# Patient Record
Sex: Male | Born: 1940 | Race: White | Hispanic: No | Marital: Married | Smoking: Current every day smoker
Health system: Southern US, Community
[De-identification: ages and names within clinical notes are randomized; demographics above are authoritative.]

## PROBLEM LIST (undated history)

## (undated) DIAGNOSIS — F419 Anxiety disorder, unspecified: Secondary | ICD-10-CM

## (undated) DIAGNOSIS — H9190 Unspecified hearing loss, unspecified ear: Secondary | ICD-10-CM

## (undated) DIAGNOSIS — J449 Chronic obstructive pulmonary disease, unspecified: Secondary | ICD-10-CM

## (undated) DIAGNOSIS — I10 Essential (primary) hypertension: Secondary | ICD-10-CM

## (undated) DIAGNOSIS — F319 Bipolar disorder, unspecified: Secondary | ICD-10-CM

## (undated) DIAGNOSIS — C801 Malignant (primary) neoplasm, unspecified: Secondary | ICD-10-CM

## (undated) DIAGNOSIS — K219 Gastro-esophageal reflux disease without esophagitis: Secondary | ICD-10-CM

## (undated) HISTORY — PX: CATARACT EXTRACTION, BILATERAL: SHX1313

## (undated) HISTORY — PX: APPENDECTOMY: SHX54

## (undated) HISTORY — PX: SKIN CANCER EXCISION: SHX779

---

## 2016-12-25 ENCOUNTER — Other Ambulatory Visit (HOSPITAL_COMMUNITY): Payer: Self-pay | Admitting: Pulmonary Disease

## 2016-12-25 DIAGNOSIS — R918 Other nonspecific abnormal finding of lung field: Secondary | ICD-10-CM

## 2016-12-30 ENCOUNTER — Ambulatory Visit
Admission: RE | Admit: 2016-12-30 | Discharge: 2016-12-30 | Disposition: A | Discharge status: Home / Self Care | Payer: Self-pay | Source: Ambulatory Visit | Attending: Pulmonary Disease | Admitting: Pulmonary Disease

## 2016-12-30 ENCOUNTER — Other Ambulatory Visit (HOSPITAL_COMMUNITY): Payer: Self-pay | Admitting: Pulmonary Disease

## 2016-12-30 DIAGNOSIS — R0602 Shortness of breath: Secondary | ICD-10-CM

## 2017-01-14 ENCOUNTER — Other Ambulatory Visit (HOSPITAL_COMMUNITY): Payer: Self-pay | Admitting: Pulmonary Disease

## 2017-01-14 DIAGNOSIS — R918 Other nonspecific abnormal finding of lung field: Secondary | ICD-10-CM

## 2017-01-28 ENCOUNTER — Ambulatory Visit (HOSPITAL_COMMUNITY): Payer: Self-pay

## 2017-02-02 ENCOUNTER — Encounter (HOSPITAL_COMMUNITY)
Admission: RE | Admit: 2017-02-02 | Discharge: 2017-02-02 | Disposition: A | Discharge status: Home / Self Care | Payer: Medicare Other | Source: Ambulatory Visit | Attending: Pulmonary Disease | Admitting: Pulmonary Disease

## 2017-02-02 DIAGNOSIS — R918 Other nonspecific abnormal finding of lung field: Secondary | ICD-10-CM | POA: Insufficient documentation

## 2017-02-02 LAB — GLUCOSE, CAPILLARY: GLUCOSE-CAPILLARY: 118 mg/dL — AB (ref 65–99)

## 2017-02-02 MED ORDER — FLUDEOXYGLUCOSE F - 18 (FDG) INJECTION
7.7000 | Freq: Once | INTRAVENOUS | Status: AC | PRN
  Administered 2017-02-02: 7.7 via INTRAVENOUS

## 2017-02-10 ENCOUNTER — Ambulatory Visit (HOSPITAL_COMMUNITY)
Admission: RE | Admit: 2017-02-10 | Discharge: 2017-02-10 | Disposition: A | Discharge status: Home / Self Care | Payer: Medicare Other | Source: Ambulatory Visit | Attending: Pulmonary Disease | Admitting: Pulmonary Disease

## 2017-02-10 ENCOUNTER — Other Ambulatory Visit: Payer: Self-pay | Admitting: Radiology

## 2017-02-11 ENCOUNTER — Ambulatory Visit (HOSPITAL_COMMUNITY)
Admission: RE | Admit: 2017-02-11 | Discharge: 2017-02-11 | Disposition: A | Discharge status: Home / Self Care | Payer: Medicare Other | Source: Ambulatory Visit | Attending: Pulmonary Disease | Admitting: Pulmonary Disease

## 2017-02-11 DIAGNOSIS — I10 Essential (primary) hypertension: Secondary | ICD-10-CM | POA: Insufficient documentation

## 2017-02-11 DIAGNOSIS — Z87891 Personal history of nicotine dependence: Secondary | ICD-10-CM | POA: Insufficient documentation

## 2017-02-11 DIAGNOSIS — E785 Hyperlipidemia, unspecified: Secondary | ICD-10-CM | POA: Insufficient documentation

## 2017-02-11 DIAGNOSIS — J449 Chronic obstructive pulmonary disease, unspecified: Secondary | ICD-10-CM | POA: Diagnosis not present

## 2017-02-11 DIAGNOSIS — Z7951 Long term (current) use of inhaled steroids: Secondary | ICD-10-CM | POA: Diagnosis not present

## 2017-02-11 DIAGNOSIS — Z79899 Other long term (current) drug therapy: Secondary | ICD-10-CM | POA: Insufficient documentation

## 2017-02-11 DIAGNOSIS — J8410 Pulmonary fibrosis, unspecified: Secondary | ICD-10-CM | POA: Diagnosis not present

## 2017-02-11 DIAGNOSIS — R918 Other nonspecific abnormal finding of lung field: Secondary | ICD-10-CM

## 2017-02-11 LAB — CBC
HEMATOCRIT: 40.7 % (ref 39.0–52.0)
Hemoglobin: 14.3 g/dL (ref 13.0–17.0)
MCH: 30.9 pg (ref 26.0–34.0)
MCHC: 35.1 g/dL (ref 30.0–36.0)
MCV: 87.9 fL (ref 78.0–100.0)
Platelets: 152 10*3/uL (ref 150–400)
RBC: 4.63 MIL/uL (ref 4.22–5.81)
RDW: 14 % (ref 11.5–15.5)
WBC: 12.9 10*3/uL — AB (ref 4.0–10.5)

## 2017-02-11 LAB — PROTIME-INR
INR: 0.93
Prothrombin Time: 12.4 seconds (ref 11.4–15.2)

## 2017-02-11 LAB — APTT: aPTT: 20 seconds — ABNORMAL LOW (ref 24–36)

## 2017-02-11 MED ORDER — SODIUM CHLORIDE 0.9 % IV SOLN: INTRAVENOUS | Status: DC

## 2017-02-11 MED ORDER — SODIUM CHLORIDE 0.9 % IV SOLN
INTRAVENOUS | Status: AC | PRN
  Administered 2017-02-11: 10 mL/h via INTRAVENOUS

## 2017-02-11 MED ORDER — FENTANYL CITRATE (PF) 100 MCG/2ML IJ SOLN
INTRAMUSCULAR | Status: AC | PRN
  Administered 2017-02-11: 50 ug via INTRAVENOUS

## 2017-02-11 MED ORDER — FENTANYL CITRATE (PF) 100 MCG/2ML IJ SOLN
INTRAMUSCULAR | Status: AC
  Filled 2017-02-11: qty 4

## 2017-02-11 MED ORDER — LIDOCAINE HCL (PF) 1 % IJ SOLN
INTRAMUSCULAR | Status: AC
  Filled 2017-02-11: qty 30

## 2017-02-11 MED ORDER — HYDROCODONE-ACETAMINOPHEN 5-325 MG PO TABS
1.0000 | ORAL_TABLET | ORAL | Status: DC | PRN
  Filled 2017-02-11: qty 2

## 2017-02-11 MED ORDER — MIDAZOLAM HCL 2 MG/2ML IJ SOLN
INTRAMUSCULAR | Status: AC | PRN
  Administered 2017-02-11: 1 mg via INTRAVENOUS

## 2017-02-11 MED ORDER — MIDAZOLAM HCL 2 MG/2ML IJ SOLN
INTRAMUSCULAR | Status: AC
  Filled 2017-02-11: qty 4

## 2017-02-11 NOTE — H&P (Signed)
Chief Complaint: Patient was seen in consultation today for lung mass  Referring Physician(s): Hawkins,Edward  Supervising Physician: Arne Cleveland  Patient Status: [facility] - Out-pt  History of Present Illness: Danny Booker is a 76 y.o. male with past medical history of COPD, chronic cough, HTN, HLD, who is followed by pulmonologist for lung nodule.   Patient underwent CTA Chest 5/8/218 who showed: 1.  No evidence for pulmonary embolism 2.  Enlarging nodular densities involving the anterior right upper lobe and posterior lateral left upper lobe.  3.  Irregular groundglass consolidation involving the posterior medial left lower lobe.  IR consulted for lung mass biopsy at the request of Dr. Luan Pulling.  Case reviewed by Dr. Earleen Newport who has approved patient for LLL mass biopsy.   Patient presents today in his usual state of health.  He states he has been feeling well.  He has been NPO.  He does not take blood thinners.   No past medical history on file.  No past surgical history on file.  Allergies: Patient has no known allergies.  Medications: Prior to Admission medications   Medication Sig Start Date End Date Taking? Authorizing Provider  amLODipine (NORVASC) 5 MG tablet Take 5 mg by mouth daily.   Yes [provider]  atorvastatin (LIPITOR) 10 MG tablet Take 10 mg by mouth at bedtime.   Yes [provider]  co-enzyme Q-10 30 MG capsule Take 30 mg by mouth 2 (two) times daily.   Yes [provider]  Fluticasone-Umeclidin-Vilant (TRELEGY ELLIPTA) 100-62.5-25 MCG/INH AEPB Inhale 1 puff into the lungs at bedtime.   Yes [provider]  Ibuprofen-Diphenhydramine HCl (ADVIL PM) 200-25 MG CAPS Take 1 tablet by mouth at bedtime as needed (for pain/sleep.).   Yes [provider]  Javier Docker Oil 500 MG CAPS Take 500 mg by mouth at bedtime.   Yes [provider]  losartan-hydrochlorothiazide (HYZAAR) 100-12.5 MG tablet Take 1 tablet by  mouth daily.   Yes [provider]  Misc Natural Products (OSTEO BI-FLEX ADV TRIPLE ST PO) Take 1 tablet by mouth 2 (two) times daily.   Yes [provider]  Multiple Vitamin (MULTIVITAMIN WITH MINERALS) TABS tablet Take 1 tablet by mouth daily. One-A-Day Men's 50+   Yes [provider]  ranitidine (ZANTAC) 150 MG tablet Take 150 mg by mouth at bedtime as needed for heartburn.   Yes [provider]  sertraline (ZOLOFT) 100 MG tablet Take 100 mg by mouth daily.   Yes [provider]     No family history on file.  Social History   Social History  . Marital status: Unknown    Spouse name: N/A  . Number of children: N/A  . Years of education: N/A   Social History Main Topics  . Smoking status: Not on file  . Smokeless tobacco: Not on file  . Alcohol use Not on file  . Drug use: Unknown  . Sexual activity: Not on file   Other Topics Concern  . Not on file   Social History Narrative  . No narrative on file    Review of Systems  Constitutional: Negative for fatigue and fever.  Respiratory: Positive for cough (chronic), shortness of breath (chronic) and wheezing (chronic).   Cardiovascular: Negative for chest pain.  Psychiatric/Behavioral: Negative for behavioral problems and confusion.    Vital Signs: BP (!) 150/72   Pulse 86   Temp 97.6 F (36.4 C)   Resp 20   Ht 6' (1.829  m)   Wt 175 lb (79.4 kg)   SpO2 92%   BMI 23.73 kg/m   Physical Exam  Constitutional: He is oriented to person, place, and time. He appears well-developed.  Cardiovascular: Normal rate, regular rhythm and normal heart sounds.   Pulmonary/Chest: Effort normal. He has wheezes. He has rales.  Coarse throughout  Neurological: He is alert and oriented to person, place, and time.  Skin: Skin is warm and dry.  Psychiatric: He has a normal mood and affect. His behavior is normal. Judgment and thought content normal.  Nursing note and vitals  reviewed.   Mallampati Score:  MD Evaluation Airway: WNL Heart: WNL Abdomen: WNL Chest/ Lungs: WNL ASA  Classification: 3 Mallampati/Airway Score: Two  Imaging: Nm Pet Image Initial (pi) Skull Base To Thigh  Result Date: 02/02/2017 CLINICAL DATA:  Initial treatment strategy for lung mass. EXAM: NUCLEAR MEDICINE PET SKULL BASE TO THIGH TECHNIQUE: 7.7 mCi F-18 FDG was injected intravenously. Full-ring PET imaging was performed from the skull base to thigh after the radiotracer. CT data was obtained and used for attenuation correction and anatomic localization. FASTING BLOOD GLUCOSE:  Value: 118 mg/dl COMPARISON:  11/26/2016 FINDINGS: NECK No hypermetabolic lymph nodes in the neck. There is a 2.8 cm mixed attenuation nodule arising from the posterior aspect of the left lobe of thyroid gland. No significant FDG uptake within this nodule. CHEST Normal heart size. Aortic atherosclerosis is identified. Calcifications within the RCA, left circumflex and LAD coronary arteries noted. Hypermetabolic mass within the posteromedial left lower lobe measures 4.4 cm and has an SUV max equal to 14.58. Separate perifissural nodule within the posterolateral left upper lobe is identified 2.6 cm and has an SUV max equal to 2.5, image 76 of series 4. Within the left upper lobe there is a nodule measuring 11 by 6 mm (mean diameter 8 mm) with an SUV max equal to 2.76. Hypermetabolic left hilar and left paratracheal lymph nodes identified. The left paratracheal node measures 9 mm and has an SUV max equal to 13.57. Right paratracheal lymph node measures 8 mm and has an SUV max equal to 3.16. ABDOMEN/PELVIS No abnormal hypermetabolic activity within the liver, pancreas, adrenal glands, or spleen. Aortic atherosclerosis. Infrarenal abdominal aorta is ectatic measuring 2.5 cm, image 134 of series 4. Nonobstructing right renal calculi identified. Hyperdense lesion arising from the inferior pole of the left kidney is incompletely  characterized without IV contrast measuring 11 mm, image 143 of series 4. No hypermetabolic lymph nodes in the abdomen or pelvis. SKELETON No focal hypermetabolic activity to suggest skeletal metastasis. IMPRESSION: 1. Mass within the posteromedial left upper lobe is suspicious for primary bronchogenic carcinoma. 2. Hypermetabolic left hilar, left paratracheal and right paratracheal lymph nodes worrisome for metastatic disease. 3. Perifissural nodularity within the posterior left upper lobe and parenchymal nodule is identified within the right upper lobe. Both of these exhibit mild increased FDG uptake. Cannot rule out metastatic disease. 4. Aortic Atherosclerosis (ICD10-I70.0). Ectatic abdominal aorta at risk for aneurysm development. Recommend followup by ultrasound in 5 years. This recommendation follows ACR consensus guidelines: White Paper of the ACR Incidental Findings Committee II on Vascular Findings. J Am Coll Radiol 2013; 10:789-794. 5. Multi vessel coronary artery calcification 6. Hyperdense lesion arising from inferior pole of left kidney is incompletely characterized without IV contrast. This may represent a hemorrhagic cyst, proteinaceous cyst or solid kidney lesion. Electronically Signed   By: Kerby Moors M.D.   On: 02/02/2017 16:08    Labs:  CBC:  Recent Labs  02/11/17 0919  WBC 12.9*  HGB 14.3  HCT 40.7  PLT 152    COAGS:  Recent Labs  02/11/17 0919  INR 0.93  APTT 20*    BMP: No results for input(s): NA, K, CL, CO2, GLUCOSE, BUN, CALCIUM, CREATININE, GFRNONAA, GFRAA in the last 8760 hours.  Invalid input(s): CMP  LIVER FUNCTION TESTS: No results for input(s): BILITOT, AST, ALT, ALKPHOS, PROT, ALBUMIN in the last 8760 hours.  TUMOR MARKERS: No results for input(s): AFPTM, CEA, CA199, CHROMGRNA in the last 8760 hours.  Assessment and Plan: Patient with extensive smoking history is found to have pulmonary nodules.  IR consulted for lung mass biopsy at the  request of Dr. Luan Pulling.  Patient presents for procedure today in his usual state of health.  He has been NPO.  He does not take blood thinners.  Risks and benefits discussed with the patient including, but not limited to bleeding, infection, damage to adjacent structures or low yield requiring additional tests. All of the patient's questions were answered, patient is agreeable to proceed. Consent signed and in chart.  Thank you for this interesting consult.  I greatly enjoyed meeting Danny Booker and look forward to participating in their care.  A copy of this report was sent to the requesting provider on this date.  Electronically Signed: Docia Barrier, PA 02/11/2017, 10:40 AM   I spent a total of  30 Minutes   in face to face in clinical consultation, greater than 50% of which was counseling/coordinating care for lung mass

## 2017-02-11 NOTE — Procedures (Signed)
  Procedure:   CT LLL lung lesion bx  18g x2 to surg path Preprocedure diagnosis:  Lung cancer Postprocedure diagnosis:  same EBL:     minimal Complications:   none immediate  See full dictation in [employer].  Dillard Cannon MD Main # [phone] Pager  [phone]

## 2017-02-11 NOTE — Sedation Documentation (Signed)
Patient denies pain and is resting comfortably.  

## 2017-02-11 NOTE — Discharge Instructions (Signed)
Needle Biopsy of the Lung, Care After °This sheet gives you information about how to care for yourself after your procedure. Your health care provider may also give you more specific instructions. If you have problems or questions, contact your health care provider. °What can I expect after the procedure? °After the procedure, it is common to have: °· Soreness, pain, and tenderness where a tissue sample was taken (biopsy site). °· A cough. °· A sore throat. ° °Follow these instructions at home: °Biopsy site care °· Follow instructions from your health care provider about when to remove the bandage that was placed on the biopsy site. °· Keep the bandage dry until it has been removed. °· Check your biopsy site every day for signs of infection. Check for: °? More redness, swelling, or pain. °? More fluid or blood. °? Warmth to the touch. °? Pus or a bad smell. °General instructions °· Rest as directed by your health care provider. Ask your health care provider what activities are safe for you. °· Do not take baths, swim, or use a hot tub until your health care provider approves. °· Take over-the-counter and prescription medicines only as told by your health care provider. °· If you have airplane travel scheduled, talk with your health care provider about when it is safe for you to travel by airplane. °· It is up to you to get the results of your procedure. Ask your health care provider, or the department that is doing the procedure, when your results will be ready. °· Keep all follow-up visits as told by your health care provider. This is important. °Contact a health care provider if: °· You have more redness, swelling, or pain around your biopsy site. °· You have more fluid or blood coming from your biopsy site. °· Your biopsy site feels warm to the touch. °· You have pus or a bad smell coming from your biopsy site. °· You have a fever. °· You have pain that does not get better with medicine. °Get help right away  if: °· You have problems breathing. °· You have chest pain. °· You cough up blood. °· You faint. °· You have a fast heart rate. °Summary °· After a needle biopsy of the lung, it is common to have a cough, a sore throat, or soreness, pain, and tenderness where a tissue sample was taken (biopsy site). °· You should check your biopsy area every day for signs of infection, including pus or a bad smell, warmth, more fluid or blood, or more redness, swelling, or pain. °· You should not take baths, swim, or use a hot tub until your health care provider approves. °· It is up to you to get the results of your procedure. Ask your health care provider, or the department that is doing the procedure, when your results will be ready. °This information is not intended to replace advice given to you by your health care provider. Make sure you discuss any questions you have with your health care provider. °Document Released: 05/04/2007 Document Revised: 05/28/2016 Document Reviewed: 05/28/2016 °Elsevier Interactive Patient Education © 2017 Elsevier Inc. ° ° °

## 2017-08-19 ENCOUNTER — Other Ambulatory Visit (HOSPITAL_COMMUNITY): Payer: Self-pay | Admitting: Pulmonary Disease

## 2017-08-19 DIAGNOSIS — R918 Other nonspecific abnormal finding of lung field: Secondary | ICD-10-CM

## 2017-08-26 ENCOUNTER — Ambulatory Visit (HOSPITAL_COMMUNITY)
Admission: RE | Admit: 2017-08-26 | Discharge: 2017-08-26 | Disposition: A | Discharge status: Home / Self Care | Payer: Medicare Other | Source: Ambulatory Visit | Attending: Pulmonary Disease | Admitting: Pulmonary Disease

## 2017-08-26 DIAGNOSIS — R918 Other nonspecific abnormal finding of lung field: Secondary | ICD-10-CM | POA: Insufficient documentation

## 2017-08-31 NOTE — Patient Instructions (Signed)
Danny Booker  08/31/2017     _0 @   Your procedure is scheduled on  09/04/2017   Report to [facility] at  615  A.M.  Call this number if you have problems the morning of surgery:  [phone]   Remember:  Do not eat food or drink liquids after midnight.  Take these medicines the morning of surgery with A SIP OF WATER  Norvasc, losartan, zantac, zoloft.   Do not wear jewelry, make-up or nail polish.  Do not wear lotions, powders, or perfumes, or deodorant.  Do not shave 48 hours prior to surgery.  Men may shave face and neck.  Do not bring valuables to the hospital.  [facility] is not responsible for any belongings or valuables.  Contacts, dentures or bridgework may not be worn into surgery.  Leave your suitcase in the car.  After surgery it may be brought to your room.  For patients admitted to the hospital, discharge time will be determined by your treatment team.  Patients discharged the day of surgery will not be allowed to drive home.   Name and phone number of your driver:   family Special instructions:  None  Please read over the following fact sheets that you were given. Anesthesia Post-op Instructions and Care and Recovery After Surgery      Flexible Bronchoscopy Bronchoscopy is a procedure used to examine the passageways in the lungs. During the procedure a thin, flexible tool with a lens and camera or eyepiece is passed in your mouth or nose, down the windpipe (trachea), and into the air tubes (bronchi). This tool allows your health care provider to carefully look at your lungs from the inside and take diagnostic samples if needed. Tell a health care provider about:  Allergies to food or medicine.  All medicines you are taking, including blood thinners, vitamins, herbs, eye drops, creams, and over-the-counter medicines.  Any problems you or family members have had with anesthetic medicines.  Any blood disorders you have.  Any  surgeries you have had.  Medical conditions you have, including heart disease, diabetes, or kidney problems.  Possibility of pregnancy, if this applies. What are the risks? Generally, this is a safe procedure. However, as with any procedure, problems can occur. Possible problems include:  Collapsed lung (pneumothorax).  Bleeding.  Increased need for oxygen or difficulty breathing after the procedure.  What happens before the procedure? Do not eat or drink anything after midnight on the night before the procedure or as directed by your health care provider. What happens during the procedure?  Relax as much as possible during the procedure.  Medicines may be given to relax you, dry up your secretions, and control coughing.  A numbing medicine (local anesthetic) will be given to numb your mouth, nose, throat, and voice box (larynx). You will be able to breathe normally during the procedure.  Samples of airway secretions may be collected for testing.  If abnormal areas are seen in your airways, tissue samples may be taken for examination under a microscope (biopsy).  If tissue samples are needed from the outer portions of the lung, a type of X-ray called fluoroscopy may be done.  If bleeding occurs, a drug may be used to stop or decrease the bleeding. What happens after the procedure?  You may receive a chest X-ray following the procedure. This is to make sure the lungs have not collapsed (pneumothorax). This information is not  intended to replace advice given to you by your health care provider. Make sure you discuss any questions you have with your health care provider. Document Released: 07/04/2000 Document Revised: 12/13/2015 Document Reviewed: 03/11/2013 Elsevier Interactive Patient Education  2017 [place].  Flexible Bronchoscopy, Care After These instructions give you information on caring for yourself after your procedure. Your doctor may also give you more specific  instructions. Call your doctor if you have any problems or questions after your procedure. Follow these instructions at home:  Do not eat or drink anything for 2 hours after your procedure. If you try to eat or drink before the medicine wears off, food or drink could go into your lungs. You could also burn yourself.  After 2 hours have passed and when you can cough and gag normally, you may eat soft food and drink liquids slowly.  The day after the test, you may eat your normal diet.  You may do your normal activities.  Keep all doctor visits. Get help right away if:  You get more and more short of breath.  You get light-headed.  You feel like you are going to pass out (faint).  You have chest pain.  You have new problems that worry you.  You cough up more than a little blood.  You cough up more blood than before. This information is not intended to replace advice given to you by your health care provider. Make sure you discuss any questions you have with your health care provider. Document Released: 05/04/2009 Document Revised: 12/13/2015 Document Reviewed: 03/11/2013 Elsevier Interactive Patient Education  2017 [place] Anesthesia is a term that refers to techniques, procedures, and medicines that help a person stay safe and comfortable during a medical procedure. Monitored anesthesia care, or sedation, is one type of anesthesia. Your anesthesia specialist may recommend sedation if you will be having a procedure that does not require you to be unconscious, such as:  Cataract surgery.  A dental procedure.  A biopsy.  A colonoscopy.  During the procedure, you may receive a medicine to help you relax (sedative). There are three levels of sedation:  Mild sedation. At this level, you may feel awake and relaxed. You will be able to follow directions.  Moderate sedation. At this level, you will be sleepy. You may not remember the  procedure.  Deep sedation. At this level, you will be asleep. You will not remember the procedure.  The more medicine you are given, the deeper your level of sedation will be. Depending on how you respond to the procedure, the anesthesia specialist may change your level of sedation or the type of anesthesia to fit your needs. An anesthesia specialist will monitor you closely during the procedure. Let your health care provider know about:  Any allergies you have.  All medicines you are taking, including vitamins, herbs, eye drops, creams, and over-the-counter medicines.  Any use of steroids (by mouth or as a cream).  Any problems you or family members have had with sedatives and anesthetic medicines.  Any blood disorders you have.  Any surgeries you have had.  Any medical conditions you have, such as sleep apnea.  Whether you are pregnant or may be pregnant.  Any use of cigarettes, alcohol, or street drugs. What are the risks? Generally, this is a safe procedure. However, problems may occur, including:  Getting too much medicine (oversedation).  Nausea.  Allergic reaction to medicines.  Trouble breathing. If this happens, a  breathing tube may be used to help with breathing. It will be removed when you are awake and breathing on your own.  Heart trouble.  Lung trouble.  Before the procedure Staying hydrated Follow instructions from your health care provider about hydration, which may include:  Up to 2 hours before the procedure - you may continue to drink clear liquids, such as water, clear fruit juice, black coffee, and plain tea.  Eating and drinking restrictions Follow instructions from your health care provider about eating and drinking, which may include:  8 hours before the procedure - stop eating heavy meals or foods such as meat, fried foods, or fatty foods.  6 hours before the procedure - stop eating light meals or foods, such as toast or cereal.  6 hours  before the procedure - stop drinking milk or drinks that contain milk.  2 hours before the procedure - stop drinking clear liquids.  Medicines Ask your health care provider about:  Changing or stopping your regular medicines. This is especially important if you are taking diabetes medicines or blood thinners.  Taking medicines such as aspirin and ibuprofen. These medicines can thin your blood. Do not take these medicines before your procedure if your health care provider instructs you not to.  Tests and exams  You will have a physical exam.  You may have blood tests done to show: ? How well your kidneys and liver are working. ? How well your blood can clot.  General instructions  Plan to have someone take you home from the hospital or clinic.  If you will be going home right after the procedure, plan to have someone with you for 24 hours.  What happens during the procedure?  Your blood pressure, heart rate, breathing, level of pain and overall condition will be monitored.  An IV tube will be inserted into one of your veins.  Your anesthesia specialist will give you medicines as needed to keep you comfortable during the procedure. This may mean changing the level of sedation.  The procedure will be performed. After the procedure  Your blood pressure, heart rate, breathing rate, and blood oxygen level will be monitored until the medicines you were given have worn off.  Do not drive for 24 hours if you received a sedative.  You may: ? Feel sleepy, clumsy, or nauseous. ? Feel forgetful about what happened after the procedure. ? Have a sore throat if you had a breathing tube during the procedure. ? Vomit. This information is not intended to replace advice given to you by your health care provider. Make sure you discuss any questions you have with your health care provider. Document Released: 04/02/2005 Document Revised: 12/14/2015 Document Reviewed: 10/28/2015 Elsevier  Interactive Patient Education  2018 [place], Care After These instructions provide you with information about caring for yourself after your procedure. Your health care provider may also give you more specific instructions. Your treatment has been planned according to current medical practices, but problems sometimes occur. Call your health care provider if you have any problems or questions after your procedure. What can I expect after the procedure? After your procedure, it is common to:  Feel sleepy for several hours.  Feel clumsy and have poor balance for several hours.  Feel forgetful about what happened after the procedure.  Have poor judgment for several hours.  Feel nauseous or vomit.  Have a sore throat if you had a breathing tube during the procedure.  Follow these  instructions at home: For at least 24 hours after the procedure:   Do not: ? Participate in activities in which you could fall or become injured. ? Drive. ? Use heavy machinery. ? Drink alcohol. ? Take sleeping pills or medicines that cause drowsiness. ? Make important decisions or sign legal documents. ? Take care of children on your own.  Rest. Eating and drinking  Follow the diet that is recommended by your health care provider.  If you vomit, drink water, juice, or soup when you can drink without vomiting.  Make sure you have little or no nausea before eating solid foods. General instructions  Have a responsible adult stay with you until you are awake and alert.  Take over-the-counter and prescription medicines only as told by your health care provider.  If you smoke, do not smoke without supervision.  Keep all follow-up visits as told by your health care provider. This is important. Contact a health care provider if:  You keep feeling nauseous or you keep vomiting.  You feel light-headed.  You develop a rash.  You have a fever. Get help right away  if:  You have trouble breathing. This information is not intended to replace advice given to you by your health care provider. Make sure you discuss any questions you have with your health care provider. Document Released: 10/28/2015 Document Revised: 02/27/2016 Document Reviewed: 10/28/2015 Elsevier Interactive Patient Education  [employer].

## 2017-09-01 ENCOUNTER — Encounter (HOSPITAL_COMMUNITY)
Admission: RE | Admit: 2017-09-01 | Discharge: 2017-09-01 | Disposition: A | Discharge status: Home / Self Care | Payer: Medicare Other | Source: Ambulatory Visit | Attending: Pulmonary Disease | Admitting: Pulmonary Disease

## 2017-09-01 ENCOUNTER — Other Ambulatory Visit: Payer: Self-pay

## 2017-09-01 ENCOUNTER — Encounter (HOSPITAL_COMMUNITY): Payer: Self-pay

## 2017-09-01 DIAGNOSIS — I10 Essential (primary) hypertension: Secondary | ICD-10-CM | POA: Diagnosis not present

## 2017-09-01 DIAGNOSIS — F319 Bipolar disorder, unspecified: Secondary | ICD-10-CM | POA: Diagnosis not present

## 2017-09-01 DIAGNOSIS — F17210 Nicotine dependence, cigarettes, uncomplicated: Secondary | ICD-10-CM | POA: Diagnosis not present

## 2017-09-01 DIAGNOSIS — C3492 Malignant neoplasm of unspecified part of left bronchus or lung: Secondary | ICD-10-CM | POA: Diagnosis not present

## 2017-09-01 DIAGNOSIS — F419 Anxiety disorder, unspecified: Secondary | ICD-10-CM | POA: Diagnosis not present

## 2017-09-01 DIAGNOSIS — K219 Gastro-esophageal reflux disease without esophagitis: Secondary | ICD-10-CM | POA: Diagnosis not present

## 2017-09-01 DIAGNOSIS — J449 Chronic obstructive pulmonary disease, unspecified: Secondary | ICD-10-CM | POA: Diagnosis not present

## 2017-09-01 DIAGNOSIS — R918 Other nonspecific abnormal finding of lung field: Secondary | ICD-10-CM | POA: Diagnosis present

## 2017-09-01 DIAGNOSIS — E041 Nontoxic single thyroid nodule: Secondary | ICD-10-CM | POA: Diagnosis not present

## 2017-09-01 DIAGNOSIS — Z85828 Personal history of other malignant neoplasm of skin: Secondary | ICD-10-CM | POA: Diagnosis not present

## 2017-09-01 DIAGNOSIS — Z79899 Other long term (current) drug therapy: Secondary | ICD-10-CM | POA: Diagnosis not present

## 2017-09-01 HISTORY — DX: Anxiety disorder, unspecified: F41.9

## 2017-09-01 HISTORY — DX: Gastro-esophageal reflux disease without esophagitis: K21.9

## 2017-09-01 HISTORY — DX: Malignant (primary) neoplasm, unspecified: C80.1

## 2017-09-01 HISTORY — DX: Chronic obstructive pulmonary disease, unspecified: J44.9

## 2017-09-01 HISTORY — DX: Bipolar disorder, unspecified: F31.9

## 2017-09-01 HISTORY — DX: Essential (primary) hypertension: I10

## 2017-09-01 HISTORY — DX: Unspecified hearing loss, unspecified ear: H91.90

## 2017-09-01 LAB — CBC WITH DIFFERENTIAL/PLATELET
Basophils Absolute: 0 10*3/uL (ref 0.0–0.1)
Basophils Relative: 0 %
EOS PCT: 2 %
Eosinophils Absolute: 0.4 10*3/uL (ref 0.0–0.7)
HCT: 36.1 % — ABNORMAL LOW (ref 39.0–52.0)
Hemoglobin: 11.6 g/dL — ABNORMAL LOW (ref 13.0–17.0)
LYMPHS PCT: 12 %
Lymphs Abs: 1.7 10*3/uL (ref 0.7–4.0)
MCH: 29.6 pg (ref 26.0–34.0)
MCHC: 32.1 g/dL (ref 30.0–36.0)
MCV: 92.1 fL (ref 78.0–100.0)
MONO ABS: 1.5 10*3/uL — AB (ref 0.1–1.0)
MONOS PCT: 11 %
Neutro Abs: 10.8 10*3/uL — ABNORMAL HIGH (ref 1.7–7.7)
Neutrophils Relative %: 75 %
PLATELETS: 519 10*3/uL — AB (ref 150–400)
RBC: 3.92 MIL/uL — AB (ref 4.22–5.81)
RDW: 12.7 % (ref 11.5–15.5)
WBC: 14.4 10*3/uL — ABNORMAL HIGH (ref 4.0–10.5)

## 2017-09-01 LAB — BASIC METABOLIC PANEL
Anion gap: 9 (ref 5–15)
BUN: 17 mg/dL (ref 6–20)
CO2: 28 mmol/L (ref 22–32)
Calcium: 9.5 mg/dL (ref 8.9–10.3)
Chloride: 93 mmol/L — ABNORMAL LOW (ref 101–111)
Creatinine, Ser: 0.63 mg/dL (ref 0.61–1.24)
GFR calc Af Amer: >60 mL/min (ref >60)
Glucose, Bld: 105 mg/dL — ABNORMAL HIGH (ref 65–99)
POTASSIUM: 4.2 mmol/L (ref 3.5–5.1)
Sodium: 130 mmol/L — ABNORMAL LOW (ref 135–145)

## 2017-09-04 ENCOUNTER — Encounter (HOSPITAL_COMMUNITY)
Admission: RE | Disposition: A | Discharge status: Home / Self Care | Payer: Self-pay | Source: Ambulatory Visit | Attending: Pulmonary Disease

## 2017-09-04 ENCOUNTER — Encounter (HOSPITAL_COMMUNITY): Payer: Self-pay | Admitting: Anesthesiology

## 2017-09-04 ENCOUNTER — Ambulatory Visit (HOSPITAL_COMMUNITY)
Admission: RE | Admit: 2017-09-04 | Discharge: 2017-09-04 | Disposition: A | Discharge status: Home / Self Care | Payer: Medicare Other | Source: Ambulatory Visit | Attending: Pulmonary Disease | Admitting: Pulmonary Disease

## 2017-09-04 ENCOUNTER — Ambulatory Visit (HOSPITAL_COMMUNITY): Payer: Medicare Other | Admitting: Anesthesiology

## 2017-09-04 DIAGNOSIS — C3492 Malignant neoplasm of unspecified part of left bronchus or lung: Secondary | ICD-10-CM | POA: Insufficient documentation

## 2017-09-04 DIAGNOSIS — J449 Chronic obstructive pulmonary disease, unspecified: Secondary | ICD-10-CM | POA: Insufficient documentation

## 2017-09-04 DIAGNOSIS — E041 Nontoxic single thyroid nodule: Secondary | ICD-10-CM | POA: Insufficient documentation

## 2017-09-04 DIAGNOSIS — Z85828 Personal history of other malignant neoplasm of skin: Secondary | ICD-10-CM | POA: Insufficient documentation

## 2017-09-04 DIAGNOSIS — F319 Bipolar disorder, unspecified: Secondary | ICD-10-CM | POA: Insufficient documentation

## 2017-09-04 DIAGNOSIS — I10 Essential (primary) hypertension: Secondary | ICD-10-CM | POA: Insufficient documentation

## 2017-09-04 DIAGNOSIS — F419 Anxiety disorder, unspecified: Secondary | ICD-10-CM | POA: Insufficient documentation

## 2017-09-04 DIAGNOSIS — F17210 Nicotine dependence, cigarettes, uncomplicated: Secondary | ICD-10-CM | POA: Insufficient documentation

## 2017-09-04 DIAGNOSIS — K219 Gastro-esophageal reflux disease without esophagitis: Secondary | ICD-10-CM | POA: Insufficient documentation

## 2017-09-04 DIAGNOSIS — Z79899 Other long term (current) drug therapy: Secondary | ICD-10-CM | POA: Insufficient documentation

## 2017-09-04 HISTORY — PX: FLEXIBLE BRONCHOSCOPY: SHX5094

## 2017-09-04 SURGERY — BRONCHOSCOPY, FLEXIBLE
Anesthesia: Monitor Anesthesia Care

## 2017-09-04 MED ORDER — LACTATED RINGERS IV SOLN
INTRAVENOUS | Status: DC
  Administered 2017-09-04 (×2): via INTRAVENOUS

## 2017-09-04 MED ORDER — LIDOCAINE VISCOUS 2 % MT SOLN
OROMUCOSAL | Status: AC
  Filled 2017-09-04: qty 15

## 2017-09-04 MED ORDER — FENTANYL CITRATE (PF) 100 MCG/2ML IJ SOLN
25.0000 ug | Freq: Once | INTRAMUSCULAR | Status: AC
  Administered 2017-09-04: 25 ug via INTRAVENOUS

## 2017-09-04 MED ORDER — BUTAMBEN-TETRACAINE-BENZOCAINE 2-2-14 % EX AERO
INHALATION_SPRAY | CUTANEOUS | Status: AC
  Filled 2017-09-04: qty 5

## 2017-09-04 MED ORDER — MIDAZOLAM HCL 2 MG/2ML IJ SOLN
INTRAMUSCULAR | Status: AC
  Filled 2017-09-04: qty 2

## 2017-09-04 MED ORDER — 0.9 % SODIUM CHLORIDE (POUR BTL) OPTIME
TOPICAL | Status: DC | PRN
  Administered 2017-09-04: 20 mL
  Administered 2017-09-04: 30 mL

## 2017-09-04 MED ORDER — IPRATROPIUM-ALBUTEROL 0.5-2.5 (3) MG/3ML IN SOLN
RESPIRATORY_TRACT | Status: AC
  Filled 2017-09-04: qty 3

## 2017-09-04 MED ORDER — LIDOCAINE HCL (PF) 2 % IJ SOLN
INTRAMUSCULAR | Status: AC
  Filled 2017-09-04: qty 20

## 2017-09-04 MED ORDER — IPRATROPIUM-ALBUTEROL 0.5-2.5 (3) MG/3ML IN SOLN
3.0000 mL | Freq: Once | RESPIRATORY_TRACT | Status: AC
  Administered 2017-09-04: 3 mL via RESPIRATORY_TRACT

## 2017-09-04 MED ORDER — PROPOFOL 10 MG/ML IV BOLUS
INTRAVENOUS | Status: AC
  Filled 2017-09-04: qty 40

## 2017-09-04 MED ORDER — FENTANYL CITRATE (PF) 100 MCG/2ML IJ SOLN
INTRAMUSCULAR | Status: AC
  Filled 2017-09-04: qty 2

## 2017-09-04 MED ORDER — IPRATROPIUM-ALBUTEROL 0.5-2.5 (3) MG/3ML IN SOLN: 3.0000 mL | RESPIRATORY_TRACT | Status: DC

## 2017-09-04 MED ORDER — LIDOCAINE HCL (PF) 2 % IJ SOLN
INTRAMUSCULAR | Status: DC | PRN
Start: 2017-09-04 — End: 2017-09-04
  Administered 2017-09-04: 5 mL

## 2017-09-04 MED ORDER — MIDAZOLAM HCL 2 MG/2ML IJ SOLN
1.0000 mg | INTRAMUSCULAR | Status: AC
  Administered 2017-09-04: 2 mg via INTRAVENOUS

## 2017-09-04 MED ORDER — PROPOFOL 500 MG/50ML IV EMUL
INTRAVENOUS | Status: DC | PRN
  Administered 2017-09-04: 100 ug/kg/min via INTRAVENOUS

## 2017-09-04 MED ORDER — LIDOCAINE VISCOUS 2 % MT SOLN
OROMUCOSAL | Status: DC | PRN
  Administered 2017-09-04: 10 mL via OROMUCOSAL

## 2017-09-04 SURGICAL SUPPLY — 16 items
BRUSH CYTOL CELLEBRITY 1.5X140 (MISCELLANEOUS) ×3 IMPLANT
CLOTH BEACON ORANGE TIMEOUT ST (SAFETY) ×3 IMPLANT
CONNECTOR 5 IN 1 STRAIGHT STRL (MISCELLANEOUS) ×3 IMPLANT
FORCEPS BIOP RJ4 1.8 (CUTTING FORCEPS) ×3 IMPLANT
GLOVE BIO SURGEON STRL SZ7.5 (GLOVE) ×3 IMPLANT
KIT CLEAN CATCH URINE (SET/KITS/TRAYS/PACK) IMPLANT
MARKER SKIN DUAL TIP RULER LAB (MISCELLANEOUS) ×3 IMPLANT
NS IRRIG 1000ML POUR BTL (IV SOLUTION) ×3 IMPLANT
SPONGE GAUZE 4X4 12PLY (GAUZE/BANDAGES/DRESSINGS) ×3 IMPLANT
SYR 20CC LL (SYRINGE) ×3 IMPLANT
SYR 30ML LL (SYRINGE) ×3 IMPLANT
SYR CONTROL 10ML LL (SYRINGE) ×3 IMPLANT
TRAP SPECIMEN CP (MISCELLANEOUS) ×3 IMPLANT
VALVE DISPOSABLE (MISCELLANEOUS) ×3 IMPLANT
WATER STERILE IRR 1000ML POUR (IV SOLUTION) ×3 IMPLANT
YANKAUER SUCT BULB TIP 10FT TU (MISCELLANEOUS) ×6 IMPLANT

## 2017-09-04 NOTE — Progress Notes (Signed)
Patient arrived to Post op area with O2 sat of 88% on 4 liters by [place]. Patient uses 3 liters by [place] at home. Discussed with Dr. Luan Pulling. Patient can go home on 4 liters for today until Saturation improved. At this time Sats between 88-93%. Wife and patient verbalized understanding.

## 2017-09-04 NOTE — Transfer of Care (Signed)
Immediate Anesthesia Transfer of Care Note  Patient: Sanad Fearnow  Procedure(s) Performed: FLEXIBLE BRONCHOSCOPY WITH PROPOFOL (N/A )  Patient Location: PACU  Anesthesia Type:MAC  Level of Consciousness: awake and alert   Airway & Oxygen Therapy: Patient Spontanous Breathing and Patient connected to face mask oxygen  Post-op Assessment: Report given to RN  Post vital signs: Reviewed and stable  Last Vitals:  Vitals:   09/04/17 0655 09/04/17 0700  BP:    Pulse:    Resp: (!) 26 (!) 29  Temp:    SpO2: 99% 98%    Last Pain:  Vitals:   09/04/17 0637  TempSrc: Oral      Patients Stated Pain Goal: 5 (74/14/23 9532)  Complications: No apparent anesthesia complications

## 2017-09-04 NOTE — H&P (Signed)
Danny Booker MRN: [id number] DOB/AGE: 1940-07-30 77 y.o. Primary Care Physician:Alazay Leicht, Percell Miller, MD Admit date: 09/04/2017 Chief Complaint: ` Abnormal chest CT HPI: This is a 77 year old with known severe COPD and who has had abnormal chest CT.  He had repeat CT for follow-up and it showed interval increase in size of and of a superior segment left lower lobe infiltrate.  He has continued to smoke.  Coughs up some sputum.  He is not having any chest pain.  He is short of breath.  No hemoptysis.  No nausea vomiting diarrhea.  No headache.  Past Medical History:  Diagnosis Date  . Anxiety   . Bipolar disorder ([place])   . Cancer ([place])    skin cancer  . COPD (chronic obstructive pulmonary disease) ([place])   . GERD (gastroesophageal reflux disease)   . HOH (hard of hearing)   . Hypertension    Past Surgical History:  Procedure Laterality Date  . APPENDECTOMY    . CATARACT EXTRACTION, BILATERAL Bilateral   . SKIN CANCER EXCISION     hand        History reviewed. No pertinent family history. No known family history of pulmonary malignancy Social History:  reports that he has been smoking cigarettes.  He has a 60.00 pack-year smoking history. he has never used smokeless tobacco. He reports that he drinks alcohol. He reports that he does not use drugs.   Allergies: No Known Allergies  Medications Prior to Admission  Medication Sig Dispense Refill  . albuterol (PROVENTIL HFA;VENTOLIN HFA) 108 (90 Base) MCG/ACT inhaler Inhale 1 puff into the lungs every 6 (six) hours as needed for wheezing or shortness of breath.    Marland Kitchen amLODipine (NORVASC) 5 MG tablet Take 5 mg by mouth daily.    Marland Kitchen atorvastatin (LIPITOR) 10 MG tablet Take 10 mg by mouth daily.     . baclofen (LIORESAL) 10 MG tablet Take 10 mg by mouth daily as needed for muscle spasms.    . Capsaicin (CAPZASIN-HP EX) Apply 1 application topically daily as needed (pain).    Marland Kitchen co-enzyme Q-10 30 MG capsule Take 30 mg by mouth 2 (two) times  daily.    . DULoxetine (CYMBALTA) 60 MG capsule Take 60 mg by mouth daily.    . Fluticasone-Umeclidin-Vilant (TRELEGY ELLIPTA) 100-62.5-25 MCG/INH AEPB Inhale 1 puff into the lungs at bedtime.    . Guaifenesin 1200 MG TB12 Take 1,200 mg by mouth daily.    Javier Docker Oil 500 MG CAPS Take 500 mg by mouth at bedtime.    Marland Kitchen losartan-hydrochlorothiazide (HYZAAR) 100-12.5 MG tablet Take 1 tablet by mouth daily.    . meloxicam (MOBIC) 15 MG tablet Take 15 mg by mouth daily.    . mirtazapine (REMERON) 15 MG tablet Take 15 mg by mouth at bedtime.    . Misc Natural Products (OSTEO BI-FLEX ADV TRIPLE ST PO) Take 1 tablet by mouth 2 (two) times daily.    . Multiple Vitamin (MULTIVITAMIN WITH MINERALS) TABS tablet Take 1 tablet by mouth daily. One-A-Day Men's 58+    . naproxen sodium (ALEVE) 220 MG tablet Take 220 mg by mouth daily as needed (pain).    . ranitidine (ZANTAC) 150 MG tablet Take 150 mg by mouth 2 (two) times daily as needed for heartburn.          XYI:AXKPV from the symptoms mentioned above,there are no other symptoms referable to all systems reviewed.  10 point review of systems performed.  Physical Exam: Blood pressure 125/75, pulse  98, temperature 97.7 F (36.5 C), temperature source Oral, resp. rate (!) 29, SpO2 98 %. Constitutional: He is awake and alert and in no acute distress.  He is thin.  He smells strongly of tobacco.  Eyes: Pupils react EOMI.  Ears nose mouth and throat: Mucous membranes are moist.  He is mildly hard of hearing.  He is edentulous.  Cardiovascular: His heart is regular with normal heart sounds.  No edema.  Respiratory: Respiratory effort is normal.  He has bilateral rhonchi on chest x-ray.  Gastrointestinal: His abdomen is soft with no masses.  Skin: Warm and dry.  Musculoskeletal: Normal strength.  Neurological: No focal abnormalities.  Psychiatric: He is mildly anxious   Recent Labs    09/01/17 1440  WBC 14.4*  NEUTROABS 10.8*  HGB 11.6*  HCT 36.1*  MCV 92.1   PLT 519*   Recent Labs    09/01/17 1440  NA 130*  K 4.2  CL 93*  CO2 28  GLUCOSE 105*  BUN 17  CREATININE 0.63  CALCIUM 9.5  lablast2(ast:2,ALT:2,alkphos:2,bilitot:2,prot:2,albumin:2)@    No results found for this or any previous visit (from the past 240 hour(s)).   Ct Chest Wo Contrast  Result Date: 08/27/2017 CLINICAL DATA:  Follow-up lung mass. Chronic short of breath six year smoker. EXAM: CT CHEST WITHOUT CONTRAST TECHNIQUE: Multidetector CT imaging of the chest was performed following the standard protocol without IV contrast. COMPARISON:  PET-CT scan 02/02/2017 FINDINGS: Cardiovascular: Coronary artery calcification and aortic atherosclerotic calcification. Mediastinum/Nodes: No axillary supraclavicular adenopathy. No supraclavicular adenopathy. No hilar adenopathy. Nodule within the LEFT lobe of thyroid gland measuring 3.0 x 2.5 cm compares with 3.0 by 2.5 cm. This was not hypermetabolic on comparison PET-CT scan. Enlarged RIGHT lower paratracheal lymph node measuring 2.5 cm is increased in size from 0.8 cm on 02/02/2017. Enlarged pretracheal lymph node measuring 12 mm on image 53, series 2 is increased from 6 mm. New lymph node in the triangular anterior mediastinal fat measures 12 mm (image 77, series 12. Lungs/Pleura: Focus of consolidation in the medial aspect of the superior LEFT lower lobe measures 5.4 x 4.0 cm compared with 6.0 by 3.2 cm. There is peripheral airspace opacities adjacent to this consolidation. Nodule along the LEFT fissure in the upper lobe measures 1.5 cm compared with 1.4 cm. The more central LEFT lower lobe lesion was hypermetabolic on comparison PET-CT scan. Nodule along the RIGHT horizontal fissure (image 84, series 4) measuring 10 mm which is not clearly seen on comparison exam. Upper Abdomen: The LEFT adrenal gland is mildly thickened similar prior. RIGHT adrenal gland normal. Musculoskeletal: No aggressive osseous lesion. IMPRESSION: 1. Interval increase in  size of mediastinal lymph nodes is concerning for progression of malignancy. 2. Interval increase in size of the consolidative process within the superior segment of the LEFT lower lobe. This lesion has changed somewhat in configuration but overall is not improved. Findings concerning for persistent malignancy. 3. Small nodule along the LEFT oblique fissure is unchanged. 4. Small nodule on the RIGHT horizontal fissure is new. 5. Recommend repeat tissue sampling of the LEFT lower lobe mass and mediastinal lymph nodes by bronchoscopy. These results will be called to the ordering clinician or representative by the Radiologist Assistant, and communication documented in the PACS or zVision Dashboard. Electronically Signed   By: Suzy Bouchard M.D.   On: 08/27/2017 09:53   Impression: He has abnormal chest CT.  He is high risk for pulmonary malignancy  He has COPD with ongoing cigarette  smoking Active Problems:   * No active hospital problems. *     Plan: For bronchoscopy      Renay Crammer L   09/04/2017, 7:21 AM

## 2017-09-04 NOTE — Anesthesia Postprocedure Evaluation (Signed)
Anesthesia Post Note  Patient: Lebaron Bautch  Procedure(s) Performed: FLEXIBLE BRONCHOSCOPY WITH PROPOFOL (N/A )  Patient location during evaluation: Short Stay Anesthesia Type: MAC Level of consciousness: awake and alert and oriented Pain management: pain level controlled Vital Signs Assessment: post-procedure vital signs reviewed and stable Respiratory status: spontaneous breathing and patient connected to nasal cannula oxygen Cardiovascular status: blood pressure returned to baseline Postop Assessment: no apparent nausea or vomiting Anesthetic complications: no     Last Vitals:  Vitals:   09/04/17 0857 09/04/17 0900  BP: 139/62   Pulse: (!) 109   Resp: (!) 24   Temp: (!) 36.4 C   SpO2: (!) 88% 92%    Last Pain:  Vitals:   09/04/17 0857  TempSrc: Oral                 Darlisa Spruiell

## 2017-09-04 NOTE — Discharge Instructions (Signed)
Do not eat or drink until 1 PM today  PATIENT INSTRUCTIONS POST-ANESTHESIA  IMMEDIATELY FOLLOWING SURGERY:  Do not drive or operate machinery for the first twenty four hours after surgery.  Do not make any important decisions for twenty four hours after surgery or while taking narcotic pain medications or sedatives.  If you develop intractable nausea and vomiting or a severe headache please notify your doctor immediately.  FOLLOW-UP:  Please make an appointment with your surgeon as instructed. You do not need to follow up with anesthesia unless specifically instructed to do so.  WOUND CARE INSTRUCTIONS (if applicable):  Keep a dry clean dressing on the anesthesia/puncture wound site if there is drainage.  Once the wound has quit draining you may leave it open to air.  Generally you should leave the bandage intact for twenty four hours unless there is drainage.  If the epidural site drains for more than 36-48 hours please call the anesthesia department.  QUESTIONS?:  Please feel free to call your physician or the hospital operator if you have any questions, and they will be happy to assist you.         Flexible Bronchoscopy, Care After These instructions give you information on caring for yourself after your procedure. Your doctor may also give you more specific instructions. Call your doctor if you have any problems or questions after your procedure. Follow these instructions at home:  Do not eat or drink anything for 2 hours after your procedure. If you try to eat or drink before the medicine wears off, food or drink could go into your lungs. You could also burn yourself.  After 2 hours have passed and when you can cough and gag normally, you may eat soft food and drink liquids slowly.  The day after the test, you may eat your normal diet.  You may do your normal activities.  Keep all doctor visits. Get help right away if:  You get more and more short of breath.  You get  light-headed.  You feel like you are going to pass out (faint).  You have chest pain.  You have new problems that worry you.  You cough up more than a little blood.  You cough up more blood than before. This information is not intended to replace advice given to you by your health care provider. Make sure you discuss any questions you have with your health care provider. Document Released: 05/04/2009 Document Revised: 12/13/2015 Document Reviewed: 03/11/2013 Elsevier Interactive Patient Education  2017 [employer].

## 2017-09-04 NOTE — Op Note (Signed)
Bronchoscopy Procedure Note Danny Booker [id number] August 30, 1940  Procedure: Bronchoscopy Indications: Diagnostic evaluation of the airways and Obtain specimens for culture and/or other diagnostic studies  Procedure Details Consent: Risks of procedure as well as the alternatives and risks of each were explained to the (patient/caregiver).  Consent for procedure obtained. Time Out: Verified patient identification, verified procedure, site/side was marked, verified correct patient position, special equipment/implants available, medications/allergies/relevent history reviewed, required imaging and test results available.  Performed  In preparation for procedure, bronchoscope lubricated. Sedation: Propofol  Airway entered and the following bronchi were examined: RUL, RML, RLL, LUL, LLL and Bronchi.  He had a large amount of secretions diffusely throughout the bronchial tree.  At approximately the bronchus intermedius he had a mass lesion and forcep biopsies washings and washings were done. Procedures performed: Brushings performed Bronchoscope removed.    Evaluation Hemodynamic Status: BP stable throughout; O2 sats: stable throughout Patient's Current Condition: stable Specimens:  Sent serosanguinous fluid for cytology brushing for cytology and forcep biopsies for pathology Complications: No apparent complications Patient did tolerate procedure well.   Labria Wos L 09/04/2017

## 2017-09-04 NOTE — Anesthesia Preprocedure Evaluation (Signed)
Anesthesia Evaluation  Patient identified by MRN, date of birth, ID band Patient awake    Reviewed: Allergy & Precautions, NPO status , Patient's Chart, lab work & pertinent test results  Airway Mallampati: II  TM Distance: >3 FB Neck ROM: Full    Dental  (+) Edentulous Upper, Edentulous Lower   Pulmonary COPD, Current Smoker,    Pulmonary exam normal        Cardiovascular hypertension, Normal cardiovascular exam     Neuro/Psych PSYCHIATRIC DISORDERS Anxiety Bipolar Disorder    GI/Hepatic GERD  ,(+)     substance abuse  alcohol use,   Endo/Other    Renal/GU      Musculoskeletal   Abdominal   Peds  Hematology   Anesthesia Other Findings   Reproductive/Obstetrics                             Anesthesia Physical Anesthesia Plan  ASA: III  Anesthesia Plan: MAC   Post-op Pain Management:    Induction: Intravenous  PONV Risk Score and Plan:   Airway Management Planned: Simple Face Mask  Additional Equipment:   Intra-op Plan:   Post-operative Plan:   Informed Consent: I have reviewed the patients History and Physical, chart, labs and discussed the procedure including the risks, benefits and alternatives for the proposed anesthesia with the patient or authorized representative who has indicated his/her understanding and acceptance.     Plan Discussed with:   Anesthesia Plan Comments:         Anesthesia Quick Evaluation

## 2017-09-08 ENCOUNTER — Encounter (HOSPITAL_COMMUNITY): Payer: Self-pay | Admitting: Pulmonary Disease

## 2017-09-11 ENCOUNTER — Inpatient Hospital Stay (HOSPITAL_COMMUNITY): Payer: Medicare Other | Attending: Internal Medicine | Admitting: Internal Medicine

## 2017-09-11 ENCOUNTER — Inpatient Hospital Stay (HOSPITAL_COMMUNITY): Payer: Medicare Other

## 2017-09-11 ENCOUNTER — Encounter (HOSPITAL_COMMUNITY): Payer: Self-pay | Admitting: Internal Medicine

## 2017-09-11 VITALS — BP 110/61 | HR 112 | Temp 98.2°F | Resp 20 | Wt 165.6 lb

## 2017-09-11 DIAGNOSIS — I10 Essential (primary) hypertension: Secondary | ICD-10-CM | POA: Insufficient documentation

## 2017-09-11 DIAGNOSIS — C3432 Malignant neoplasm of lower lobe, left bronchus or lung: Secondary | ICD-10-CM

## 2017-09-11 DIAGNOSIS — R490 Dysphonia: Secondary | ICD-10-CM | POA: Insufficient documentation

## 2017-09-11 DIAGNOSIS — IMO0002 Reserved for concepts with insufficient information to code with codable children: Secondary | ICD-10-CM

## 2017-09-11 DIAGNOSIS — C782 Secondary malignant neoplasm of pleura: Secondary | ICD-10-CM | POA: Insufficient documentation

## 2017-09-11 DIAGNOSIS — C3490 Malignant neoplasm of unspecified part of unspecified bronchus or lung: Secondary | ICD-10-CM | POA: Insufficient documentation

## 2017-09-11 DIAGNOSIS — I2510 Atherosclerotic heart disease of native coronary artery without angina pectoris: Secondary | ICD-10-CM | POA: Diagnosis not present

## 2017-09-11 DIAGNOSIS — R590 Localized enlarged lymph nodes: Secondary | ICD-10-CM | POA: Diagnosis not present

## 2017-09-11 DIAGNOSIS — E041 Nontoxic single thyroid nodule: Secondary | ICD-10-CM | POA: Insufficient documentation

## 2017-09-11 DIAGNOSIS — J449 Chronic obstructive pulmonary disease, unspecified: Secondary | ICD-10-CM | POA: Insufficient documentation

## 2017-09-11 LAB — CBC WITH DIFFERENTIAL/PLATELET
Basophils Absolute: 0 10*3/uL (ref 0.0–0.1)
Basophils Relative: 0 %
EOS PCT: 3 %
Eosinophils Absolute: 0.4 10*3/uL (ref 0.0–0.7)
HEMATOCRIT: 32.7 % — AB (ref 39.0–52.0)
Hemoglobin: 10.6 g/dL — ABNORMAL LOW (ref 13.0–17.0)
LYMPHS ABS: 2.1 10*3/uL (ref 0.7–4.0)
Lymphocytes Relative: 17 %
MCH: 29.7 pg (ref 26.0–34.0)
MCHC: 32.4 g/dL (ref 30.0–36.0)
MCV: 91.6 fL (ref 78.0–100.0)
MONO ABS: 1.3 10*3/uL — AB (ref 0.1–1.0)
MONOS PCT: 11 %
NEUTROS ABS: 8.4 10*3/uL — AB (ref 1.7–7.7)
Neutrophils Relative %: 69 %
PLATELETS: 473 10*3/uL — AB (ref 150–400)
RBC: 3.57 MIL/uL — AB (ref 4.22–5.81)
RDW: 12.7 % (ref 11.5–15.5)
WBC: 12.2 10*3/uL — ABNORMAL HIGH (ref 4.0–10.5)

## 2017-09-11 LAB — COMPREHENSIVE METABOLIC PANEL
ALT: 18 U/L (ref 17–63)
ANION GAP: 12 (ref 5–15)
AST: 18 U/L (ref 15–41)
Albumin: 3.1 g/dL — ABNORMAL LOW (ref 3.5–5.0)
Alkaline Phosphatase: 98 U/L (ref 38–126)
BILIRUBIN TOTAL: 0.4 mg/dL (ref 0.3–1.2)
BUN: 16 mg/dL (ref 6–20)
CHLORIDE: 93 mmol/L — AB (ref 101–111)
CO2: 29 mmol/L (ref 22–32)
Calcium: 10.1 mg/dL (ref 8.9–10.3)
Creatinine, Ser: 0.58 mg/dL — ABNORMAL LOW (ref 0.61–1.24)
Glucose, Bld: 98 mg/dL (ref 65–99)
Potassium: 4.8 mmol/L (ref 3.5–5.1)
Sodium: 134 mmol/L — ABNORMAL LOW (ref 135–145)
TOTAL PROTEIN: 7.8 g/dL (ref 6.5–8.1)

## 2017-09-11 LAB — LACTATE DEHYDROGENASE: LDH: 115 U/L (ref 98–192)

## 2017-09-11 NOTE — Patient Instructions (Addendum)
[facility] Discharge Instructions  RECOMMENDATIONS MADE BY THE CONSULTANT AND ANY TEST RESULTS WILL BE SENT TO YOUR REFERRING PHYSICIAN.  You say Dr. 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  This type of cancer usually comes from smoking. This could be the cause of your newly developed raspy voice. We are going to schedule you for CT scans and MRI of the brain.  This will determine the staging of your cancer.  We will decide on treatment (chemotherapy vs radiation) after we get the results of the scans.   Follow up with our office in 2 weeks to review your scans and go over treatment plan.   Thank you for choosing [place] at [facility] to provide your oncology and hematology care.  To afford each patient quality time with our provider, please arrive at least 15 minutes before your scheduled appointment time.    If you have a lab appointment with the [facility] please come in thru the  Main Entrance and check in at the main information desk  You need to re-schedule your appointment should you arrive 10 or more minutes late.  We strive to give you quality time with our providers, and arriving late affects you and other patients whose appointments are after yours.  Also, if you no show three or more times for appointments you may be dismissed from the clinic at the providers discretion.     Again, thank you for choosing [facility].  Our hope is that these requests will decrease the amount of time that you wait before being seen by our physicians.       _____________________________________________________________  Should you have questions after your visit to [facility], please contact our office at [phone] between the hours of 8:30 a.m. and 4:30 p.m.  Voicemails left after 4:30 p.m. will not be returned until the following business day.  For prescription refill requests, have your  pharmacy contact our office.       Resources For Cancer Patients and their Caregivers ? American Cancer Society: Can assist with transportation, wigs, general needs, runs Look Good Feel Better.        402-416-8385 ? Cancer Care: Provides financial assistance, online support groups, medication/co-pay assistance.  1-800-813-HOPE (608)705-4381) ? [employer] Assists [place] cancer patients and their families through emotional , educational and financial support.  9840648783 ? [place] DSS Where to apply for food stamps, Medicaid and utility assistance. 412-156-3645 ? [employer]: Transportation to medical appointments. 346-117-5662 ? Social Security Administration: May apply for disability if have a Stage IV cancer. 934-499-8744 (901) 048-3761 ? [employer], Disability and Transit Services: Assists with nutrition, care and transit needs. [place] Support Programs: @10RELATIVEDAYS @ > Cancer Support Group  2nd Tuesday of the month 1pm-2pm, Journey Room  > Creative Journey  3rd Tuesday of the month 1130am-1pm, Journey Room  > Look Good Feel Better  1st Wednesday of the month 10am-12 noon, Journey Room (Call [place] to register [phone])

## 2017-09-15 ENCOUNTER — Encounter (HOSPITAL_COMMUNITY): Payer: Self-pay | Admitting: *Deleted

## 2017-09-15 ENCOUNTER — Other Ambulatory Visit (HOSPITAL_COMMUNITY): Payer: Self-pay

## 2017-09-15 ENCOUNTER — Other Ambulatory Visit (HOSPITAL_COMMUNITY): Payer: Self-pay | Admitting: Internal Medicine

## 2017-09-15 ENCOUNTER — Inpatient Hospital Stay (HOSPITAL_COMMUNITY): Payer: Medicare Other | Admitting: Dietician

## 2017-09-15 ENCOUNTER — Ambulatory Visit (HOSPITAL_COMMUNITY)
Admission: RE | Admit: 2017-09-15 | Discharge: 2017-09-15 | Disposition: A | Discharge status: Home / Self Care | Payer: Medicare Other | Source: Ambulatory Visit | Attending: Internal Medicine | Admitting: Internal Medicine

## 2017-09-15 DIAGNOSIS — G9389 Other specified disorders of brain: Secondary | ICD-10-CM | POA: Diagnosis not present

## 2017-09-15 DIAGNOSIS — C3490 Malignant neoplasm of unspecified part of unspecified bronchus or lung: Secondary | ICD-10-CM | POA: Insufficient documentation

## 2017-09-15 DIAGNOSIS — IMO0002 Reserved for concepts with insufficient information to code with codable children: Secondary | ICD-10-CM

## 2017-09-15 DIAGNOSIS — Z538 Procedure and treatment not carried out for other reasons: Secondary | ICD-10-CM | POA: Diagnosis not present

## 2017-09-15 MED ORDER — GADOBENATE DIMEGLUMINE 529 MG/ML IV SOLN: 15.0000 mL | Freq: Once | INTRAVENOUS | Status: DC | PRN

## 2017-09-15 MED ORDER — LORAZEPAM 1 MG PO TABS: ORAL_TABLET | ORAL | 0 refills | Status: AC

## 2017-09-15 NOTE — Progress Notes (Signed)
Nutrition Assessment  Reason for Assessment: Consult  ASSESSMENT: 77 y/o male PMHx Anxiety, COPD, GERD, Bipolar Disorder, HTN. 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 Seen prior to treatment.   Patient arrived early as he had a panic attack during MRI and was unable to complete it. Accompanied by wife and son.   He says his appetite is "zero". While the pt says this has been only going on for 3 weeks or so, his wife says his appetite has been poor x6 months.   Took diet recall: Morning meal: (around lunch time): Bowl of cereal w/ apple juice. Coffee (w/ 2x sugar) PM snack: Boost. Dinner: Can of condensed soup+ Boost   In addition to his poor appetite, he has had taste changes. He says food tastes like "wet toilet paper".   He has some early satiety and if he continues to eat after full, he experiences some nausea. No vomiting or constipation. He had one episode of diarrhea this morning, but this is not typical.   He reports a weight loss of 22 lbs since august and that 186 lbs is his UBW. He says he had been stable at this weight prior to August. There is no chart history to corroborate weight history.  He notes he owns 39 chickens and is able to obtain fresh eggs on a regular basis.   Nutrition Focused Physical Exam: Moderate underarm fat wasting. Mild orbital fat wasting. Mild- moderate upper body muscle wasting. Unable to assess lower body.   Medications: Just started on mirtazapine. +mvi w/ min. Losartan-HCTZ.   Labs: Hgb:10.6 Recent Labs  Lab 09/11/17 1553  NA 134*  K 4.8  CL 93*  CO2 29  BUN 16  CREATININE 0.58*  CALCIUM 10.1  GLUCOSE 98   Anthropometrics:  Height: 5\' 11"  (180.34 cm) Weight: 165 lbs 10 oz (75.27) UBW: 186 lbs BMI: 23.11  Estimated Energy Needs Kcals: 2050-2250 (27-30 kcal/kg bw) Protein: 90-105g Pro (1.2-1.4 g/kg bw) Fluid: 2.1-2.3 L fluid (1 ml/kcal)  NUTRITION DIAGNOSIS: Inadequate oral intake related to poor appetite,  early satiety, taste changes and other cancer related symptoms AEB reported loss of 20 lbs since August, patients diet recall and mild-moderate muscle/fat wasting  MALNUTRITION DIAGNOSIS: Moderate w/ criteria of mild-moderate fat/muscle wasting  INTERVENTION:  Reviewed with patient/family WHY nutrition is essential during cancer tx. Explained that poor nutritional status can lead to delays/reductions in treatment that can ultimately lead to poorer outcomes.   Explained the pillars of nutrition during oncology care: Eat small frequent meals, eat kcal/protein dense items as often as possible and choose foods that will be most appropriate for the side effects/symptoms the patient is suffering from.   He has a disordered eating pattern secondary to sleeping majority of day. He goes to bed ~11 pm and sleeps until noon. Also naps during day. RD asks him to eat a small snack when he gets up at night as he cannot go this long w/o eating.   Currently, his first meal has minimal protein/calories. RD reviewed protein sources. He was agreeable to eating breakfast meats or eggs in the morning as well as a Boost/Ensure. His second and final meal is a convinience item. RD asked the patient to consume something more substantial.    RD explained concept of never eating a food by itself.He should add toppings, condiments, dressings, creams, syrups etc to meals. Examples given are adding whipped cream, chocolate syrup fruit to pancakes, adding cheese to soup, mayo to sandwiches and heavy  gravy to mashed potatoes. These items are calorie dense and take up little space in the stomach.   He was also agreeable to consuming an Ensure in between his two meals. He was reluctant to eat something before bed due to acid reflux.   In regards to his taste changes. He has already noted that sweeter items retain their normal taste better. Advocated for the patient to add sweet substance to his meals, such as marinades to meats.    RD reviewed the Ensure Assistance Program. They were interested and accepted their first case of Ensure today. Gave coupons for [employer] and handout titled "Increasing Calories and Protein" with the above recommendations written on it.   MONITORING, EVALUATION, GOAL: Oral intake to meet >90% needs, wt stability vs gain.   NEXT VISIT: 2-3 weeks depending on other patient appt scheduling  Burtis Junes RD, LDN, CNSC Clinical Nutrition Pager: [phone] 09/15/2017 10:03 AM

## 2017-09-15 NOTE — Progress Notes (Signed)
Patient came to the office today after attempting his MRI.  Patient states that he had a panic attack in the MRI and was unable to complete the test.  He states that he has never been that scared before and "I was traumatized by this experience."  I explained to the patient that we need the MRI for staging of his cancer so that we can properly treat him.  I offered to send him a prescription for a medication to relax him.  He states " I would just like to assume I have nothing in my head because it reminded me of the war when they would interrogate the prisoners with buckets over the head and hit on it with rocks and hammers until they went crazy"  " I can not do the test"    I offered to him the open MRI at a different facility and he said "NO".    He did say that he would get the PET scan tomorrow but would like something to help calm his nerves for that.  Order received for 1 mg Ativan take day of procedure  30 minutes before your test.  That was called into his pharmacy [facility] in [place], [place].  I advised patient to go home and think about the test and if he decides that he does want to get it to please call us and we would reschedule it.  He says "I doubt I will but I will think about it"

## 2017-09-16 ENCOUNTER — Ambulatory Visit (HOSPITAL_COMMUNITY)
Admission: RE | Admit: 2017-09-16 | Discharge: 2017-09-16 | Disposition: A | Discharge status: Home / Self Care | Payer: Medicare Other | Source: Ambulatory Visit | Attending: Internal Medicine | Admitting: Internal Medicine

## 2017-09-16 ENCOUNTER — Encounter: Payer: Self-pay | Admitting: General Practice

## 2017-09-16 DIAGNOSIS — E041 Nontoxic single thyroid nodule: Secondary | ICD-10-CM | POA: Insufficient documentation

## 2017-09-16 DIAGNOSIS — I700 Atherosclerosis of aorta: Secondary | ICD-10-CM | POA: Diagnosis not present

## 2017-09-16 DIAGNOSIS — C782 Secondary malignant neoplasm of pleura: Secondary | ICD-10-CM | POA: Insufficient documentation

## 2017-09-16 DIAGNOSIS — C3490 Malignant neoplasm of unspecified part of unspecified bronchus or lung: Secondary | ICD-10-CM | POA: Insufficient documentation

## 2017-09-16 DIAGNOSIS — R590 Localized enlarged lymph nodes: Secondary | ICD-10-CM | POA: Insufficient documentation

## 2017-09-16 DIAGNOSIS — Z79899 Other long term (current) drug therapy: Secondary | ICD-10-CM | POA: Diagnosis not present

## 2017-09-16 DIAGNOSIS — IMO0002 Reserved for concepts with insufficient information to code with codable children: Secondary | ICD-10-CM

## 2017-09-16 LAB — GLUCOSE, CAPILLARY: GLUCOSE-CAPILLARY: 97 mg/dL (ref 65–99)

## 2017-09-16 MED ORDER — FLUDEOXYGLUCOSE F - 18 (FDG) INJECTION
9.6000 | Freq: Once | INTRAVENOUS | Status: AC | PRN
  Administered 2017-09-16: 9.6 via INTRAVENOUS

## 2017-09-16 NOTE — Progress Notes (Signed)
[place] Psychosocial Distress Screening Clinical Social Work  Clinical Social Work was referred by distress screening protocol.  The patient scored a 5 on the Psychosocial Distress Thermometer which indicates moderate distress. Clinical Social Worker Edwyna Shell to assess for distress and other psychosocial needs. CSW spoke w wife as patient was prepping for PET scan today and anxious, discussed common feeling and emotions when being diagnosed with cancer, and the importance of support during treatment. CSW informed wife of the support team and support services at [facility]. CSW provided contact information and encouraged patient to call with any questions or concerns.  Patient was unable to complete MRI due to panic attack related to noise and confined space.  Has PET scan today, was given medication to reduce anxiety.  Per wife, no history of anxiety or mental health treatment "this came out of the blue."  Has good support at home, wife is LPN and has supported family member through lung cancer treatment in past.  Patient has adequate transport and support.  CSW mentioned option of contacting [place] for help w anxiety management related to cancer treatment.  Will mail packet of information w CSW contact information.  ONCBCN DISTRESS SCREENING 09/11/2017  Screening Type Initial Screening  Distress experienced in past week (1-10) 5  Emotional problem type Adjusting to illness  Information Concerns Type Lack of info about diagnosis;Lack of info about treatment  Physician notified of physical symptoms Yes  Referral to clinical psychology No  Referral to clinical social work Yes  Referral to dietition No  Referral to financial advocate No  Referral to support programs No  Referral to palliative care No    Clinical Social Worker follow up needed: No.  If yes, follow up plan:  Edwyna Shell, LCSW Clinical Social Worker Phone:  [phone]

## 2017-09-17 ENCOUNTER — Encounter (HOSPITAL_COMMUNITY): Payer: Self-pay | Admitting: Internal Medicine

## 2017-09-17 ENCOUNTER — Other Ambulatory Visit: Payer: Self-pay

## 2017-09-17 ENCOUNTER — Ambulatory Visit (HOSPITAL_COMMUNITY)
Admission: RE | Admit: 2017-09-17 | Discharge: 2017-09-17 | Disposition: A | Discharge status: Home / Self Care | Payer: Medicare Other | Source: Ambulatory Visit | Attending: Internal Medicine | Admitting: Internal Medicine

## 2017-09-17 ENCOUNTER — Inpatient Hospital Stay (HOSPITAL_BASED_OUTPATIENT_CLINIC_OR_DEPARTMENT_OTHER): Payer: Medicare Other | Admitting: Internal Medicine

## 2017-09-17 ENCOUNTER — Other Ambulatory Visit (HOSPITAL_COMMUNITY): Payer: Self-pay | Admitting: Pharmacist

## 2017-09-17 VITALS — BP 101/55 | HR 103 | Temp 97.8°F | Resp 20 | Ht 71.0 in | Wt 165.0 lb

## 2017-09-17 DIAGNOSIS — Z9842 Cataract extraction status, left eye: Secondary | ICD-10-CM | POA: Diagnosis not present

## 2017-09-17 DIAGNOSIS — M2548 Effusion, other site: Secondary | ICD-10-CM | POA: Insufficient documentation

## 2017-09-17 DIAGNOSIS — C3490 Malignant neoplasm of unspecified part of unspecified bronchus or lung: Secondary | ICD-10-CM | POA: Diagnosis present

## 2017-09-17 DIAGNOSIS — F17210 Nicotine dependence, cigarettes, uncomplicated: Secondary | ICD-10-CM | POA: Insufficient documentation

## 2017-09-17 DIAGNOSIS — C3492 Malignant neoplasm of unspecified part of left bronchus or lung: Secondary | ICD-10-CM | POA: Diagnosis not present

## 2017-09-17 DIAGNOSIS — J449 Chronic obstructive pulmonary disease, unspecified: Secondary | ICD-10-CM | POA: Diagnosis not present

## 2017-09-17 DIAGNOSIS — Z961 Presence of intraocular lens: Secondary | ICD-10-CM | POA: Insufficient documentation

## 2017-09-17 DIAGNOSIS — I2510 Atherosclerotic heart disease of native coronary artery without angina pectoris: Secondary | ICD-10-CM

## 2017-09-17 DIAGNOSIS — K219 Gastro-esophageal reflux disease without esophagitis: Secondary | ICD-10-CM | POA: Insufficient documentation

## 2017-09-17 DIAGNOSIS — I998 Other disorder of circulatory system: Secondary | ICD-10-CM | POA: Insufficient documentation

## 2017-09-17 DIAGNOSIS — C782 Secondary malignant neoplasm of pleura: Secondary | ICD-10-CM

## 2017-09-17 DIAGNOSIS — I10 Essential (primary) hypertension: Secondary | ICD-10-CM

## 2017-09-17 DIAGNOSIS — R490 Dysphonia: Secondary | ICD-10-CM

## 2017-09-17 DIAGNOSIS — Z823 Family history of stroke: Secondary | ICD-10-CM | POA: Diagnosis not present

## 2017-09-17 DIAGNOSIS — R590 Localized enlarged lymph nodes: Secondary | ICD-10-CM

## 2017-09-17 DIAGNOSIS — Z79899 Other long term (current) drug therapy: Secondary | ICD-10-CM | POA: Diagnosis not present

## 2017-09-17 DIAGNOSIS — F419 Anxiety disorder, unspecified: Secondary | ICD-10-CM | POA: Diagnosis not present

## 2017-09-17 DIAGNOSIS — Z9841 Cataract extraction status, right eye: Secondary | ICD-10-CM | POA: Diagnosis not present

## 2017-09-17 DIAGNOSIS — Z8249 Family history of ischemic heart disease and other diseases of the circulatory system: Secondary | ICD-10-CM | POA: Insufficient documentation

## 2017-09-17 DIAGNOSIS — Z7951 Long term (current) use of inhaled steroids: Secondary | ICD-10-CM | POA: Insufficient documentation

## 2017-09-17 DIAGNOSIS — F319 Bipolar disorder, unspecified: Secondary | ICD-10-CM | POA: Diagnosis not present

## 2017-09-17 DIAGNOSIS — Z806 Family history of leukemia: Secondary | ICD-10-CM | POA: Insufficient documentation

## 2017-09-17 DIAGNOSIS — Z85828 Personal history of other malignant neoplasm of skin: Secondary | ICD-10-CM | POA: Insufficient documentation

## 2017-09-17 DIAGNOSIS — E041 Nontoxic single thyroid nodule: Secondary | ICD-10-CM

## 2017-09-17 MED ORDER — IOPAMIDOL (ISOVUE-300) INJECTION 61%
75.0000 mL | Freq: Once | INTRAVENOUS | Status: AC | PRN
  Administered 2017-09-17: 75 mL via INTRAVENOUS

## 2017-09-17 NOTE — Progress Notes (Signed)
Diagnosis Squamous cell carcinoma - Plan: Ambulatory referral to Social Work, CBC with Differential/Platelet, Comprehensive metabolic panel, Lactate dehydrogenase, NM PET Image Initial (PI) Skull Base To Thigh, Ambulatory Referral to DSME/T, CANCELED: MR Brain W Wo Contrast  Staging Cancer Staging No matching staging information was found for the patient.  Assessment and Plan:1.   Advanced non-small cell lung cancer.  The patient originally had an abnormal scan done in May 2018 which showed densities in the right upper lobe and posterior lateral left upper lobe.  Unclear why patient never had a biopsy performed based on his initial evaluation in May 2018.  He subsequently had a CT of the chest done 08/26/2017 that showed interval increase in mediastinal regression of malignancy.  There was also interval increase in the size of the process in the left lower lobe which was concerning for persistent malignancy.  There was a small nodule along the left oblique fissure.  There was also a small nodule in the right horizontal fissure that was new.  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  Long talk was had with the patient and his family.  Findings appear concerning for advanced lung cancer.  He will undergo a PET scan for initial staging.  He will also undergo an MRI of the brain.  Patient will return to clinic in 1 week for follow-up to go over the results and make treatment recommendations.  He has developed symptoms with hoarseness and weight loss.  2.  Hoarseness.  This is likely secondary to advanced squamous cell lung cancer.  He denies any trouble swallowing or breathing concerns today other than minor shortness of breath.  He will return to clinic in 1 week for follow-up.  3.  Hypertension.  Blood pressure is 110/61.  He should continue to follow with his primary care physician.  4.  COPD.  He has a long smoking history.   He is followed by Dr. Luan Pulling.  HPI:  77 y.o. male with past medical history of COPD, chronic cough, HTN, HLD, who is followed by pulmonologist for lung nodule.   Patient underwent CTA Chest 5/8/218 who showed: 1.  No evidence for pulmonary embolism 2.  Enlarging nodular densities involving the anterior right upper lobe and posterior lateral left upper lobe.  3.  Irregular groundglass consolidation involving the posterior medial left lower lobe.  IR consulted for lung mass biopsy at the request of Dr. Luan Pulling.  Case reviewed by Dr. Earleen Newport who has approved patient for LLL mass biopsy.   Unclear if patient ever had a biopsy performed at his initial evaluation in 11/2016.  Patient had a recent CT of the chest done on 08/26/2017 which showed interval increase in mediastinal lymph nodes concerning for progression of malignancy.  There was also interval increase in size of the process in the left lower lobe.  These were concerning for persistent malignancy.  There was also a small nodule along the left oblique fissure fissure.  There was also a small nodule on the right horizontal fissure that was new.  He underwent biopsy of the left lung lesion on 09/04/2017 that showed poorly differentiated squamous cell cancer.  He reports he has noted some hoarseness and  weight loss.  He is here today for evaluation recent diagnosis of squamous cell carcinoma of the lung.  Problem List There are no active problems to display for this patient.   Past Medical History Past Medical  History:  Diagnosis Date  . Anxiety   . Bipolar disorder ([place])   . Cancer ([place])    skin cancer  . COPD (chronic obstructive pulmonary disease) ([place])   . GERD (gastroesophageal reflux disease)   . HOH (hard of hearing)   . Hypertension     Past Surgical History Past Surgical History:  Procedure Laterality Date  . APPENDECTOMY    . CATARACT EXTRACTION, BILATERAL Bilateral   . FLEXIBLE BRONCHOSCOPY N/A 09/04/2017   Procedure:  FLEXIBLE BRONCHOSCOPY WITH PROPOFOL;  Surgeon: Sinda Du, MD;  Location: [facility];  Service: Cardiopulmonary;  Laterality: N/A;  . SKIN CANCER EXCISION     hand    Family History Family History  Problem Relation Age of Onset  . Heart attack Mother   . Stroke Father   . Leukemia Brother   . Other Brother      Social History  reports that he has been smoking cigarettes.  He has a 60.00 pack-year smoking history. he has never used smokeless tobacco. He reports that he drinks alcohol. He reports that he does not use drugs.  Medications  Current Outpatient Medications:  .  albuterol (PROVENTIL HFA;VENTOLIN HFA) 108 (90 Base) MCG/ACT inhaler, Inhale 1 puff into the lungs every 6 (six) hours as needed for wheezing or shortness of breath., Disp: , Rfl:  .  amLODipine (NORVASC) 5 MG tablet, Take 5 mg by mouth daily., Disp: , Rfl:  .  atorvastatin (LIPITOR) 10 MG tablet, Take 10 mg by mouth daily. , Disp: , Rfl:  .  baclofen (LIORESAL) 10 MG tablet, Take 10 mg by mouth daily as needed for muscle spasms., Disp: , Rfl:  .  Capsaicin (CAPZASIN-HP EX), Apply 1 application topically daily as needed (pain)., Disp: , Rfl:  .  co-enzyme Q-10 30 MG capsule, Take 30 mg by mouth 2 (two) times daily., Disp: , Rfl:  .  DULoxetine (CYMBALTA) 60 MG capsule, Take 60 mg by mouth daily., Disp: , Rfl:  .  Fluticasone-Umeclidin-Vilant (TRELEGY ELLIPTA) 100-62.5-25 MCG/INH AEPB, Inhale 1 puff into the lungs at bedtime., Disp: , Rfl:  .  Guaifenesin 1200 MG TB12, Take 1,200 mg by mouth daily., Disp: , Rfl:  .  Krill Oil 500 MG CAPS, Take 500 mg by mouth at bedtime., Disp: , Rfl:  .  losartan-hydrochlorothiazide (HYZAAR) 100-12.5 MG tablet, Take 1 tablet by mouth daily., Disp: , Rfl:  .  meloxicam (MOBIC) 15 MG tablet, Take 15 mg by mouth daily., Disp: , Rfl:  .  mirtazapine (REMERON) 15 MG tablet, Take 15 mg by mouth at bedtime., Disp: , Rfl:  .  Misc Natural Products (OSTEO BI-FLEX ADV TRIPLE ST PO),  Take 1 tablet by mouth 2 (two) times daily., Disp: , Rfl:  .  Multiple Vitamin (MULTIVITAMIN WITH MINERALS) TABS tablet, Take 1 tablet by mouth daily. One-A-Day Men's 50+, Disp: , Rfl:  .  naproxen sodium (ALEVE) 220 MG tablet, Take 220 mg by mouth daily as needed (pain)., Disp: , Rfl:  .  ranitidine (ZANTAC) 150 MG tablet, Take 150 mg by mouth 2 (two) times daily as needed for heartburn. , Disp: , Rfl:  .  LORazepam (ATIVAN) 1 MG tablet, Take 1 pill by mouth the day of procedure about 30 minutes before you arrive., Disp: 1 tablet, Rfl: 0  Allergies Patient has no known allergies.  Review of Systems Review of Systems - Oncology ROS as per HPI otherwise 12 point ROS is negative other than hoarseness and weight loss  and cough.   Physical Exam  Vitals Wt Readings from Last 3 Encounters:  09/17/17 165 lb (74.8 kg)  09/11/17 165 lb 9.6 oz (75.1 kg)  09/01/17 166 lb (75.3 kg)   Temp Readings from Last 3 Encounters:  09/17/17 97.8 F (36.6 C) (Oral)  09/11/17 98.2 F (36.8 C) (Oral)  09/04/17 (!) 97.5 F (36.4 C) (Oral)   BP Readings from Last 3 Encounters:  09/17/17 (!) 101/55  09/11/17 110/61  09/04/17 139/62   Pulse Readings from Last 3 Encounters:  09/17/17 (!) 103  09/11/17 (!) 112  09/04/17 (!) 109   Constitutional: Thin male sitting in a wheelchair in no acute distress.  His voice is hoarse. HENT: Head: Normocephalic and atraumatic.  Mouth/Throat: No oropharyngeal exudate. Mucosa moist. Eyes: Pupils are equal, round, and reactive to light. Conjunctivae are normal. No scleral icterus.  Neck: Normal range of motion. Neck supple. No JVD present.  Cardiovascular: Normal rate, regular rhythm and normal heart sounds.  Exam reveals no gallop and no friction rub.   No murmur heard. Pulmonary/Chest: Effort normal.  Coarse breath sounds. Abdominal: Soft. Bowel sounds are normal. No distension. There is no tenderness. There is no guarding.  Musculoskeletal: No edema or  tenderness.  Lymphadenopathy: No cervical or supraclavicular adenopathy.  Neurological: Alert and oriented to person, place, and time. No cranial nerve deficit.  Skin: Skin is warm and dry. No rash noted. No erythema. No pallor.  Psychiatric: Affect and judgment normal.   Labs Appointment on 09/11/2017  Component Date Value Ref Range Status  . WBC 09/11/2017 12.2* 4.0 - 10.5 K/uL Final  . RBC 09/11/2017 3.57* 4.22 - 5.81 MIL/uL Final  . Hemoglobin 09/11/2017 10.6* 13.0 - 17.0 g/dL Final  . HCT 09/11/2017 32.7* 39.0 - 52.0 % Final  . MCV 09/11/2017 91.6  78.0 - 100.0 fL Final  . MCH 09/11/2017 29.7  26.0 - 34.0 pg Final  . MCHC 09/11/2017 32.4  30.0 - 36.0 g/dL Final  . RDW 09/11/2017 12.7  11.5 - 15.5 % Final  . Platelets 09/11/2017 473* 150 - 400 K/uL Final  . Neutrophils Relative % 09/11/2017 69  % Final  . Lymphocytes Relative 09/11/2017 17  % Final  . Monocytes Relative 09/11/2017 11  % Final  . Eosinophils Relative 09/11/2017 3  % Final  . Basophils Relative 09/11/2017 0  % Final  . Neutro Abs 09/11/2017 8.4* 1.7 - 7.7 K/uL Final  . Lymphs Abs 09/11/2017 2.1  0.7 - 4.0 K/uL Final  . Monocytes Absolute 09/11/2017 1.3* 0.1 - 1.0 K/uL Final  . Eosinophils Absolute 09/11/2017 0.4  0.0 - 0.7 K/uL Final  . Basophils Absolute 09/11/2017 0.0  0.0 - 0.1 K/uL Final  . WBC Morphology 09/11/2017 MILD LEFT SHIFT (1-5% METAS, OCC MYELO, OCC BANDS)   Final   Performed at [facility], [address], [place], [place] [address]  . Sodium 09/11/2017 134* 135 - 145 mmol/L Final  . Potassium 09/11/2017 4.8  3.5 - 5.1 mmol/L Final  . Chloride 09/11/2017 93* 101 - 111 mmol/L Final  . CO2 09/11/2017 29  22 - 32 mmol/L Final  . Glucose, Bld 09/11/2017 98  65 - 99 mg/dL Final  . BUN 09/11/2017 16  6 - 20 mg/dL Final  . Creatinine, Ser 09/11/2017 0.58* 0.61 - 1.24 mg/dL Final  . Calcium 09/11/2017 10.1  8.9 - 10.3 mg/dL Final  . Total Protein 09/11/2017 7.8  6.5 - 8.1 g/dL Final  . Albumin  09/11/2017 3.1* 3.5 -  5.0 g/dL Final  . AST 09/11/2017 18  15 - 41 U/L Final  . ALT 09/11/2017 18  17 - 63 U/L Final  . Alkaline Phosphatase 09/11/2017 98  38 - 126 U/L Final  . Total Bilirubin 09/11/2017 0.4  0.3 - 1.2 mg/dL Final  . GFR calc non Af Amer 09/11/2017 >60  >60 mL/min Final  . GFR calc Af Amer 09/11/2017 >60  >60 mL/min Final   Comment: (NOTE) The eGFR has been calculated using the CKD EPI equation. This calculation has not been validated in all clinical situations. eGFR's persistently <60 mL/min signify possible Chronic Kidney Disease.   Georgiann Hahn gap 09/11/2017 12  5 - 15 Final   Performed at [facility], [address] [place], [place] [address]  . LDH 09/11/2017 115  98 - 192 U/L Final   Performed at [facility], [address] [place], [place] [address]     Pathology Orders Placed This Encounter  Procedures  . NM PET Image Initial (PI) Skull Base To Thigh    Standing Status:   Future    Number of Occurrences:   1    Standing Expiration Date:   09/11/2018    Order Specific Question:   If indicated for the ordered procedure, I authorize the administration of a radiopharmaceutical per Radiology protocol    Answer:   Yes    Order Specific Question:   Preferred imaging location?    Answer:   [facility]    Order Specific Question:   Radiology Contrast Protocol - do NOT remove file path    Answer:   [email or web address]  . CBC with Differential/Platelet    Standing Status:   Future    Number of Occurrences:   1    Standing Expiration Date:   09/11/2018  . Comprehensive metabolic panel    Standing Status:   Future    Number of Occurrences:   1    Standing Expiration Date:   09/11/2018  . Lactate dehydrogenase    Standing Status:   Future    Number of Occurrences:   1    Standing Expiration Date:   09/11/2018  . Ambulatory referral to Social Work    Referral Priority:   Routine    Referral Type:   Consultation    Referral Reason:    Specialty Services Required    Number of Visits Requested:   1  . Ambulatory Referral to DSME/T    Referral Priority:   Routine    Referral Type:   Consultation    Referral Reason:   Specialty Services Required    Number of Visits Requested:   1     Zoila Shutter MD   Cc:  Dr. Luan Pulling

## 2017-09-17 NOTE — Patient Instructions (Signed)
[place] at [facility] Discharge Instructions  RECOMMENDATIONS MADE BY THE CONSULTANT AND ANY TEST RESULTS WILL BE SENT TO YOUR REFERRING PHYSICIAN.  You were seen today by Dr. Zoila Shutter You have advanced stage lung cancer We would like to get you scheduled for CT scan of Brain as soon as possible this will give [place] an idea if it has spread to the brain We would also like to get your PICC line placed.  This is how you would receive your chemotherapy treatment. We would like to start you on Carboplatin/Taxol for the first treatment and then on the second treatment we would add Keytruda You have been given information on all 3 of these drugs The main side effects of Carboplatin are kidney issues - we will check the kidney functions often Taxol - numbness/tingling of the fingertips or toes Keytruda - respiratory or gastrointestinal issues You would get these drugs every 3 weeks  We will get you an appointment with our nurse navigator Joanne Gavel, RN and she will teach you more about the drugs. We will plan to start your treatments as soon as possible.   Thank you for choosing [facility] at [facility] to provide your oncology and hematology care.  To afford each patient quality time with our provider, please arrive at least 15 minutes before your scheduled appointment time.    If you have a lab appointment with the [facility] please come in thru the  Main Entrance and check in at the main information desk  You need to re-schedule your appointment should you arrive 10 or more minutes late.  We strive to give you quality time with our providers, and arriving late affects you and other patients whose appointments are after yours.  Also, if you no show three or more times for appointments you may be dismissed from the clinic at the providers discretion.     Again, thank you for choosing [facility].  Our hope is that these  requests will decrease the amount of time that you wait before being seen by our physicians.       _____________________________________________________________  Should you have questions after your visit to [facility], please contact our office at [phone] between the hours of 8:30 a.m. and 4:30 p.m.  Voicemails left after 4:30 p.m. will not be returned until the following business day.  For prescription refill requests, have your pharmacy contact our office.       Resources For Cancer Patients and their Caregivers ? American Cancer Society: Can assist with transportation, wigs, general needs, runs Look Good Feel Better.        443-410-9028 ? Cancer Care: Provides financial assistance, online support groups, medication/co-pay assistance.  1-800-813-HOPE 3130383121) ? [place] [facility] Assists [place] cancer patients and their families through emotional , educational and financial support.  804-634-5532 ? [place] DSS Where to apply for food stamps, Medicaid and utility assistance. (639)541-9722 ? [facility]: Transportation to medical appointments. (506)876-1387 ? Social Security Administration: May apply for disability if have a Stage IV cancer. 410-615-7974 367-854-4336 ? [employer], Disability and Transit Services: Assists with nutrition, care and transit needs. [place] Support Programs: @10RELATIVEDAYS @ > Cancer Support Group  2nd Tuesday of the month 1pm-2pm, Journey Room  > Creative Journey  3rd Tuesday of the month 1130am-1pm, Journey Room  > Look Good Feel Better  1st Wednesday of the month  51ZG-01 noon, Journey Room (Call [place] to register [phone])

## 2017-09-18 ENCOUNTER — Inpatient Hospital Stay (HOSPITAL_COMMUNITY): Payer: Medicare Other | Attending: Internal Medicine

## 2017-09-18 ENCOUNTER — Telehealth (HOSPITAL_COMMUNITY): Payer: Self-pay

## 2017-09-18 DIAGNOSIS — R531 Weakness: Secondary | ICD-10-CM | POA: Insufficient documentation

## 2017-09-18 DIAGNOSIS — Z5111 Encounter for antineoplastic chemotherapy: Secondary | ICD-10-CM | POA: Insufficient documentation

## 2017-09-18 DIAGNOSIS — C3490 Malignant neoplasm of unspecified part of unspecified bronchus or lung: Secondary | ICD-10-CM | POA: Insufficient documentation

## 2017-09-18 DIAGNOSIS — I10 Essential (primary) hypertension: Secondary | ICD-10-CM | POA: Insufficient documentation

## 2017-09-18 NOTE — Telephone Encounter (Signed)
Per Dr. Walden Field request, I called patient and left message that his CT brain was negative for metastasis. Call cancer center if any questions.

## 2017-09-22 MED ORDER — ONDANSETRON HCL 8 MG PO TABS: 8.0000 mg | ORAL_TABLET | Freq: Two times a day (BID) | ORAL | 1 refills | Status: DC | PRN

## 2017-09-22 MED ORDER — PROCHLORPERAZINE MALEATE 10 MG PO TABS
10.0000 mg | ORAL_TABLET | Freq: Four times a day (QID) | ORAL | 1 refills | Status: DC | PRN
Start: 2017-09-22 — End: 2018-11-06

## 2017-09-22 NOTE — Patient Instructions (Signed)
[place]   CHEMOTHERAPY INSTRUCTIONS  You have been diagnosed with Stage 4 lung cancer.  We are going to treat you with carboplatin, abraxane, and keytruda every 3 weeks.  One cycle is 3 weeks.  You will not start keytruda until the second cycle.  You will get abraxane days 1, 8, 15 every 21 days (abraxane is weekly).  You will get carboplatin day 1 every 21 days (every 3 weeks).  You will get keytruda day 1 every 21 days (every 3 weeks).  This is with palliative intent, which means that you are treatable but not curable.  You will see the doctor regularly throughout treatment.  We monitor your lab work prior to every treatment.  The doctor monitors your response to treatment by the way you are feeling, your blood work, and scans periodically.  There will be wait times while you are here for treatment.  It will take about 30 minutes to 1 hour for the lab work to result.  Then the pharmacy has to mix your medications.     You will receive the following pre-medications prior to receiving chemotherapy on day 1: Premeds: Aloxi (IV push)- high powered nausea/vomiting prevention medication used for chemotherapy patients.  Dexamethasone - steroid - given to reduce the risk of you having an allergic type reaction to the chemotherapy. Dex can cause you to feel energized, nervous/anxious/jittery, make you have trouble sleeping, and/or make you feel hot/flushed in the face/neck and/or look pink/red in the face/neck. These side effects will pass as the Dex wears off. (takes 20 minutes to infuse) On day 8 and day 15 of chemotherapy, you will receive the pre-medication: Compazine:  nausea pill help prevent nausea.       POTENTIAL SIDE EFFECTS OF TREATMENT:  Carboplatin (Generic Name) Other Names: Paraplatin, CBDCA  About This Drug Carboplatin is a drug used to treat cancer. This drug is given in the vein (IV).  Takes 30 minutes to infuse.   Possible Side Effects  (More Common) . Nausea and throwing up (vomiting). These symptoms may happen within a few hours after your treatment and may last up to 24 hours. Medicines are available to stop or lessen these side effects. . Bone marrow depression. This is a decrease in the number of white blood cells, red blood cells, and platelets. This may raise your risk of infection, make you tired and weak (fatigue), and raise your risk of bleeding. . Soreness of the mouth and throat. You may have red areas, white patches, or sores that hurt. . This drug may affect how your kidneys work. Your kidney function will be checked as needed. . Electrolyte changes. Your blood will be checked for electrolyte changes as needed.  Possible Side Effects (Less Common) . Hair loss. Some patients lose their hair on the scalp and body. You may notice your hair thinning seven to 14 days after getting this drug. . Effects on the nerves are called peripheral neuropathy. You may feel numbness, tingling, or pain in your hands and feet. It may be hard for you to button your clothes, open jars, or walk as usual. The effect on the nerves may get worse with more doses of the drug. These effects get better in some people after the drug is stopped but it does not get better in all people. . Loose bowel movements (diarrhea) that may last for several days . Decreased hearing or ringing in the ears . Changes in the way food  and drinks taste . Changes in liver function. Your liver function will be checked as needed.     Allergic Reactions Serious allergic reactions including anaphylaxis are rare. While you are getting this drug in your vein (IV), tell your nurse right away if you have any of these symptoms of an allergic reaction: . Trouble catching your breath . Feeling like your tongue or throat are swelling . Feeling your heart beat quickly or in a not normal way (palpitations) . Feeling dizzy or lightheaded . Flushing, itching, rash, and/or  hives  Treating Side Effects . Drink 6-8 cups of fluids each day unless your doctor has told you to limit your fluid intake due to some other health problem. A cup is 8 ounces of fluid. If you throw up or have loose bowel movements, you should drink more fluids so that you do not become dehydrated (lack water in the body from losing too much fluid). . Mouth care is very important. Your mouth care should consist of routine, gentle cleaning of your teeth or dentures and rinsing your mouth with a mixture of 1/2 teaspoon of salt in 8 ounces of water or  teaspoon of baking soda in 8 ounces of water. This should be done at least after each meal and at bedtime. . If you have mouth sores, avoid mouthwash that has alcohol. Avoid alcohol and smoking because they can bother your mouth and throat. . If you have numbness and tingling in your hands and feet, be careful when cooking, walking, and handling sharp objects and hot liquids. . Talk with your nurse about getting a wig before you lose your hair. Also, call the [facility] at 800-ACS-2345 to find out information about the "Look Good, Feel Better" program close to where you live. It is a free program where women getting chemotherapy can learn about wigs, turbans and scarves as well as makeup techniques and skin and nail care.  Food and Drug Interactions There are no known interactions of carboplatin with food. This drug may interact with other medicines. Tell your doctor and pharmacist about all the medicines and dietary supplements (vitamins, minerals, herbs and others) that you are taking at this time. The safety and use of dietary supplements and alternative diets are often not known. Using these might affect your cancer or interfere with your treatment. Until more is known, you should not use dietary supplements or alternative diets without your cancer doctor's help. When to Call the Doctor Call your doctor or nurse right away if you have any  of these symptoms: . Fever of 100.5 F (38 C) or above; chills . Bleeding or bruising that is not normal . Wheezing or trouble breathing . Nausea that stops you from eating or drinking . Throwing up more than once a day . Rash or itching . Loose bowel movements (diarrhea) more than four times a day or diarrhea with weakness or feeling lightheaded . Call your doctor or nurse as soon as possible if any of these symptoms happen: . Numbness, tingling, decreased feeling or weakness in fingers, toes, arms, or legs . Change in hearing, ringing in the ears . Blurred vision or other changes in eyesight . Decreased urine . Yellowing of skin or eyes  Sexual Problems and Reproductive Concerns Sexual problems and reproduction concerns may happen. In both men and women, this drug may affect your ability to have children. This cannot be determined before your treatment. Talk with your doctor or nurse if you plan to  have children. Ask for information on sperm or egg banking. In men, this drug may interfere with your ability to make sperm, but it should not change your ability to have sexual relations. In women, menstrual bleeding may become irregular or stop while you are getting this drug. Do not assume that you cannot become pregnant if you do not have a menstrual period. Women may go through signs of menopause (change of life) like vaginal dryness or itching. Vaginal lubricants can be used to lessen vaginal dryness, itching, and pain during sexual relations. Genetic counseling is available for you to talk about the effects of this drug therapy on future pregnancies. Also, a genetic counselor can look at the possible risk of problems in the unborn baby due to this medicine if an exposure happens during pregnancy. . Pregnancy warning: This drug may have harmful effects on the unborn child, so effective methods of birth control should be used during your cancer treatment. . Breast feeding warning: It is not known  if this drug passes into breast milk. For this reason, women should talk to their doctor about the risks and benefits of breast feeding during treatment with this drug because this drug may enter the breast milk and badly harm a breast feeding baby.   Nab-paclitaxel (protein bound) (Abraxane)  About This Drug Nab-paclitaxel is used to treat cancer. It is given in the vein (IV).  This drug takes 30 minutes to infuse.  Possible Side Effects . Bone marrow suppression. This is a decrease in the number of white blood cells, red blood cells, and platelets. This may raise your risk of infection, make you tired and weak (fatigue), and raise your risk of bleeding. . Abnormal heart beat, blood pressure, and/or abnormal EKG (electrocardiogram) . Nausea and vomiting (throwing up) . Diarrhea (loose bowel movements) . Tiredness, weakness . Swelling of your legs, ankles and/or feet . Fever . Changes in your liver function . Infection . Dehydration (lack of water in the body from losing too much fluid) . Decreased appetite (decreased hunger) . Joint, bone and muscle pain . Rash . Effects on the nerves are called peripheral neuropathy. You may feel numbness, tingling, or pain in your hands and feet. It may be hard for you to button your clothes, open jars, or walk as usual. The effect on the nerves may get worse with more doses of the drug. These effects get better in some people after the drug is stopped but it does not get better in all people. . Hair loss. Hair loss is often temporary, although with certain medicine, hair loss can sometimes be permanent. Hair loss may happen suddenly or gradually. If you lose hair, you may lose it from your head, face, armpits, pubic area, chest, and/or legs. You may also notice your hair getting thin. Note: Each of the side effects above was reported in 20% or greater of patients treated with nabpaclitaxel. Not all possible side effects are included above.  Warnings and  Precautions . Severe bone marrow suppression . Inflammation (swelling) of the lungs. You may have a dry cough or trouble breathing. . Severe infections which can be life-threatening . Severe peripheral neuropathy . Allergic reactions, including anaphylaxis are rare but may happen in some patients, which can be life-threatening. Signs of allergic reaction to this drug may be swelling of the face, feeling like your tongue or throat are swelling, trouble breathing, rash, itching, fever, chills, feeling dizzy, and/or feeling that your heart is beating in a fast  or not normal way. If this happens, do not take another dose of this drug. You should get urgent medical treatment. . This drug contains albumin, which is a protein from donated human blood. There is a rare risk of transmission of viral diseases. Note: Some of the side effects above are very rare. If you have concerns and/or questions, please discuss them with your medical team.  Important Information . This drug may be present in the saliva, tears, sweat, urine, stool, vomit, semen, and vaginal secretions. Talk to your doctor and/or your nurse about the necessary precautions to take during this time.   Treating Side Effects . To decrease the risk of infection, wash your hands regularly. . Avoid close contact with people who have a cold, the flu, or other infections. . Take your temperature as your doctor or nurse tells you, and whenever you feel like you may have a fever. . To decrease your risk of bleeding, use a soft toothbrush. Check with your nurse before using dental floss. . Be very careful when using knives or tools. . Use an electric shaver instead of a razor. . Manage tiredness by pacing your activities for the day. Be sure to include periods of rest between energy-draining activities. . Drink plenty of fluids (a minimum of eight glasses per day is recommended). . To help with decreased appetite, eat small, frequent meals. . Eat  high caloric food such as pudding, ice cream, yogurt and milkshakes. . If you throw up or have loose bowel movements, you should drink more fluids so that you do not become dehydrated (lack of water in the body from losing too much fluid). . To help with nausea and vomiting, eat small, frequent meals instead of three large meals a day. Choose foods and drinks that are at room temperature. Ask your nurse or doctor about other helpful tips and medicine that is available to help or stop lessen these symptoms. . If you get diarrhea, eat low-fiber foods that are high in protein and calories and avoid foods that can irritate your digestive tracts or lead to cramping. . Ask your nurse or doctor about medicine that can lessen or stop your diarrhea. . To help with hair loss, wash hair with a mild shampoo and avoid washing your hair every day. . Avoid rubbing your scalp, pat your hair or scalp dry. . Avoid coloring your hair. . Limit your use of hair spray, electric curlers, blow dryers, and curling irons. . If you are interested in getting a wig, talk to your nurse. You can also call the [facility] at 800-ACS-2345 to find out information about the "Look Good, Feel Better" program close to where you live. It is a free program where women getting chemotherapy can learn about wigs, turbans and scarves as well as makeup techniques and skin and nail care. Marland Kitchen Keeping your pain under control is important to your well-being. Please tell your doctor or nurse if you are experiencing pain. . If you get a rash do not put anything on it unless your doctor or nurse says you may. Keep the area around the rash clean and dry. Ask your doctor for medicine if your rash bothers you. . If you have numbness and tingling in your hands and feet, be careful when cooking, walking, and handling sharp objects and hot liquids.  Food and Drug Interactions . There are no known interactions of nab-paclitaxel of with food. .  This drug may interact with other medicines. Tell  your doctor and pharmacist about all the prescription and over-the-counter medicines and dietary supplements (vitamins, minerals, herbs and others) that you are taking at this time. Also, check with your doctor or pharmacist before starting any new prescription or over-the-counter medicines, or dietary supplements to make sure that there are no interactions.  When to Call the Doctor Call your doctor or nurse if you have any of these symptoms and/or any new or unusual symptoms: . Fever of 100.5 F (38 C) or higher . Chills . Signs of a local infection such as pain, redness, tenderness, warmth and/or swelling . Easy bleeding or bruising . Wheezing or trouble breathing . Pain in your chest . Dry cough . New rash and/or itching . Rash that is not relieved by prescribed medicines . Feeling dizzy or lightheaded . Feeling that your heart is beating in a fast or not normal way (palpitations) . Diarrhea, 4 times in one day or diarrhea with weakness or lightheadedness . Nausea that stops you from eating or drinking and/or that isn't relieved by prescribed medicines . Throwing up more than 3 times a day . Lasting loss of appetite or rapid weight loss of five pounds in a week . Pain that does not go away or is not relieved by prescribed medicine . Numbness, tingling, or pain your hands and feet . Weight gain of 5 pounds in one week (fluid retention) . Signs of liver problems: dark urine, pale bowel movements, bad stomach pain, feeling very tired and weak, unusual itching, or yellowing of the eyes or skin . Signs of allergic reaction: swelling of the face, feeling like your tongue or throat are swelling, trouble breathing, rash, itching, fever, chills, feeling dizzy, and/or feeling that your heart is beating in a fast or not normal way . If you think you are pregnant or have impregnated your partner  Reproduction Warnings . Pregnancy warning: This drug  can have harmful effects on the unborn baby. Women of childbearing potential should use effective methods of birth control during your cancer treatment and for at least 6 months after treatment. Men with male partners of childbearing potential should use effective methods of birth control during your cancer treatment and for at least 3 months after your cancer treatment. Let your doctor know right away if you think you may be pregnant or may have impregnated your partner. . Breastfeeding warning: It is not known if this drug passes into breast milk. For this reason, women should not breastfeed during treatment and for 2 weeks after treatment because this drug could enter the breast milk and cause harm to a breastfeeding baby. . Fertility Warning: In men and women both, this drug may affect your ability to have children in the future. Talk with your doctor or nurse if you plan to have children. Ask for information on sperm or egg banking.   Pembrolizumab Beryle Flock)  About This Drug Pembrolizumab is used to treat cancer. It is given in the vein (IV).  This drug will take 30 minutes to infuse.  Possible Side Effects . Tiredness . Fever . Nausea . Decreased appetite (decreased hunger) . Loose bowel movements (diarrhea) . Constipation (not able to move bowels) . Trouble breathing . Rash . Itching . Muscle and bone pain . Cough Note: Each of the side effects above was reported in 20% or greater of patients treated with pembrolizumab. Not all possible side effects are included above.  Warnings and Precautions . This drug works with your immune system and can  cause inflammation in any of your organs and tissues and can change how they work. This may put you at risk for developing serious medical problems which can very rarely be fatal. . Colitis (swelling (inflammation) in the colon) - symptoms are loose bowel movements (diarrhea) stomach cramping, and sometimes blood in the bowel movements .  Changes in liver function. Your liver function will be checked as needed. . Changes in kidney function, which can very rarely be fatal. Your kidney function will be checked as needed. . Inflammation (swelling) of the lungs which can very rarely be fatal - you may have a dry cough or trouble breathing. . This drug may affect some of your hormone glands (especially the thyroid, adrenals, pituitary and pancreas). Your hormone levels will be checked as needed. . Blood sugar levels may change and you may develop diabetes. If you already have diabetes, changes may need to be made to your diabetes medication. . Severe allergic skin reaction, which can very rarely be fatal. You may develop blisters on your skin that are filled with fluid or a severe red rash all over your body that may be painful. . Increased risk of organ rejection in patients who have received donor organs . Increased risk of complications in patients who will undergo a stem cell transplant after receiving pembrolizumab. . While you are getting this drug in your vein (IV), you may have a reaction to the drug. Your nurse will check you closely for these signs: fever or shaking chills, flushing, facial swelling, feeling dizzy, headache, trouble breathing, rash, itching, chest tightness, or chest pain. These reactions may occur after your infusion. If this happens, call 911 for emergency care.  Important Information . This drug may be present in the saliva, tears, sweat, urine, stool, vomit, semen, and vaginal secretions. Talk to your doctor and/or your nurse about the necessary precautions to take during this time.  Treating Side Effects . Ask your doctor or nurse about medicines that are available to help stop or lessen constipation, diarrhea and/or nausea. . Drink plenty of fluids (a minimum of eight glasses per day is recommended). . If you are not able to move your bowels, check with your doctor or nurse before you use any enemas,  laxatives, or suppositories . To help with nausea and vomiting, eat small, frequent meals instead of three large meals a day. Choose foods and drinks that are at room temperature. Ask your nurse or doctor about other helpful tips and medicine that is available to help or stop lessen these symptoms. . If you get diarrhea, eat low-fiber foods that are high in protein and calories and avoid foods that can irritate your digestive tracts or lead to cramping. Ask your nurse or doctor about medicine that can lessen or stop your diarrhea. . Manage tiredness by pacing your activities for the day. Be sure to include periods of rest between energy-draining activities . Keeping your pain under control is important to your wellbeing. Please tell your doctor or nurse if you are experiencing pain. . If you have diabetes, keep good control of your blood sugar level. Tell your nurse or your doctor if your glucose levels are higher or lower than normal . If you get a rash do not put anything on it unless your doctor or nurse says you may. Keep the area around the rash clean and dry. Ask your doctor for medicine if your rash bothers you. . Infusion reactions may happen for 24 hours after your infusion.  If this happens, call 911 for emergency care.  Food and Drug Interactions . There are no known interactions of pembrolizumab with food. . There are no known interactions of pembrolizumab with other medications. . Tell your doctor and pharmacist about all the medicines and dietary supplements (vitamins, minerals, herbs and others) that you are taking at this time. The safety and use of dietary supplements and alternative agents are often not known. Using these might affect your cancer or interfere with your treatment. Until more is known, you should not use dietary supplements or alternative agents without your cancer doctor's help.  When to Call the Doctor Call your doctor or nurse if you have any of the following  symptoms and/or any new or unusual symptoms: . Fever of 100.5 F (38 C) or higher . Chills . Wheezing or trouble breathing . Rash or itching . Feeling dizzy or lightheaded . Loose bowel movements (diarrhea) more than 4 times a day or diarrhea with weakness or lightheadedness . Nausea that stops you from eating or drinking, and/or that is not relieved by prescribed medicines . Lasting loss of appetite or rapid weight loss of five pounds in a week . Fatigue that interferes with your daily activities . No bowel movement for 3 days or you feel uncomfortable . Extreme weakness that interferes with normal activities . Bad abdominal pain, especially in upper right area . Decreased urine . Unusual thirst or passing urine often . Rash that is not relieved by prescribed medicines . Flu-like symptoms: fever, headache, muscle and joint aches, and fatigue (low energy, feeling weak) . Signs of liver problems: dark urine, pale bowel movements, bad stomach pain, feeling very tired and weak, unusual itching, or yellowing of the eyes or skin . Signs of infusion reactions such as fever or shaking chills, flushing, facial swelling, feeling dizzy, headache, trouble breathing, rash, itching, chest tightness, or chest pain. . If you think you are pregnant  Reproduction Warnings . Pregnancy warning: This drug may have harmful effects on the unborn baby. Women of childbearing potential should use effective methods of birth control during your cancer treatment and for at least 4 months after treatment. Let your doctor know right away if you think you may be pregnant . Breast feeding warning: It is not known if this drug passes into breast milk. It is recommended that women do not breastfeed during treatment and for 4 months after treatment. . Fertility warning: Human fertility studies have not been done with this drug. Talk with your doctor or nurse if you plan to have children.     SELF CARE ACTIVITIES WHILE ON  CHEMOTHERAPY:  Hydration Increase your fluid intake 48 hours prior to treatment and drink at least 8 to 12 cups (64 ounces) of water/decaff beverages per day after treatment. You can still have your cup of coffee or soda but these beverages do not count as part of your 8 to 12 cups that you need to drink daily. No alcohol intake.  Medications Continue taking your normal prescription medication as prescribed.  If you start any new herbal or new supplements, please let us know first to make sure it is safe.  Mouth Care Have teeth cleaned professionally before starting treatment. Keep dentures and partial plates clean. Use soft toothbrush and do not use mouthwashes that contain alcohol. Biotene is a good mouthwash that is available at most pharmacies or may be ordered by calling [phone]. Use warm salt water gargles (1 teaspoon salt per 1 quart  warm water) before and after meals and at bedtime. Or you may rinse with 2 tablespoons of three-percent hydrogen peroxide mixed in eight ounces of water. If you are still having problems with your mouth or sores in your mouth please call the clinic. If you need dental work, please let the doctor know before you go for your appointment so that we can coordinate the best possible time for you in regards to your chemo regimen. You need to also let your dentist know that you are actively taking chemo. We may need to do labs prior to your dental appointment.  Skin Care Always use sunscreen that has not expired and with SPF (Sun Protection Factor) of 50 or higher. Wear hats to protect your head from the sun. Remember to use sunscreen on your hands, ears, face, & feet.  Use good moisturizing lotions such as udder cream, eucerin, or even Vaseline. Some chemotherapies can cause dry skin, color changes in your skin and nails.    . Avoid long, hot showers or baths. . Use gentle, fragrance-free soaps and laundry detergent. . Use moisturizers, preferably creams or  ointments rather than lotions because the thicker consistency is better at preventing skin dehydration. Apply the cream or ointment within 15 minutes of showering. Reapply moisturizer at night, and moisturize your hands every time after you wash them.  Hair Loss (if your doctor says your hair will fall out)  . If your doctor says that your hair is likely to fall out, decide before you begin chemo whether you want to wear a wig. You may want to shop before treatment to match your hair color. . Hats, turbans, and scarves can also camouflage hair loss, although some people prefer to leave their heads uncovered. If you go bareheaded outdoors, be sure to use sunscreen on your scalp. . Cut your hair short. It eases the inconvenience of shedding lots of hair, but it also can reduce the emotional impact of watching your hair fall out. . Don't perm or color your hair during chemotherapy. Those chemical treatments are already damaging to hair and can enhance hair loss. Once your chemo treatments are done and your hair has grown back, it's OK to resume dyeing or perming hair. With chemotherapy, hair loss is usually temporary. But when it grows back, it may be a different color or texture. In older adults who still had hair color before chemotherapy, the new growth may be completely gray.  Often, new hair is very fine and soft.  Infection Prevention Please wash your hands for at least 30 seconds using warm soapy water. Handwashing is the #1 way to prevent the spread of germs. Stay away from sick people or people who are getting over a cold. If you develop respiratory systems such as green/yellow mucus production or productive cough or persistent cough let us know and we will see if you need an antibiotic. It is a good idea to keep a pair of gloves on when going into grocery stores/[employer] to decrease your risk of coming into contact with germs on the carts, etc. Carry alcohol hand gel with you at all times and use it  frequently if out in public. If your temperature reaches 100.5 or higher please call the clinic and let us know.  If it is after hours or on the weekend please go to the ER if your temperature is over 100.5.  Please have your own personal thermometer at home to use.    Sex and bodily fluids  If you are going to have sex, a condom must be used to protect the person that isn't taking chemotherapy. Chemo can decrease your libido (sex drive). For a few days after chemotherapy, chemotherapy can be excreted through your bodily fluids.  When using the toilet please close the lid and flush the toilet twice.  Do this for a few day after you have had chemotherapy.   Effects of chemotherapy on your sex life Some changes are simple and won't last long. They won't affect your sex life permanently. Sometimes you may feel: . too tired . not strong enough to be very active . sick or sore  . not in the mood . anxious or low Your anxiety might not seem related to sex. For example, you may be worried about the cancer and how your treatment is going. Or you may be worried about money, or about how you family are coping with your illness. These things can cause stress, which can affect your interest in sex. It is important to talk to your partner about how you feel. Remember - the changes to your sex life do not usually last long. There is usually no medical reason to stop having sex during chemo. The drugs will not have any long term physical effects on your performance or enjoyment of sex. Cancer cannot be passed on to your partner during sex.  Contraception It is important to use reliable contraception during treatment. Avoid getting pregnant while you or your partner are having chemotherapy. This is because the drugs may harm the baby. Sometimes chemotherapy drugs can leave a man or woman infertile.  This means you would not be able to have children in the future. You might want to talk to someone about permanent  infertility. It can be very difficult to learn that you may no longer be able to have children. Some people find counselling helpful. There might be ways to preserve your fertility, although this is easier for men than for women. You may want to speak to a fertility expert. You can talk about sperm banking or harvesting your eggs. You can also ask about other fertility options, such as donor eggs. If you have or have had breast cancer, your doctor might advise you not to take the contraceptive pill. This is because the hormones in it might affect the cancer.  It is not known for sure whether chemotherapy drugs can be passed on through semen or secretions from the vagina. Because of this, some doctors advise people to use a barrier method if you have sex during treatment. This applies to vaginal, anal or oral sex. Generally, doctors advise a barrier method only for the time you are actually having the treatment and for about a week after your treatment. Advice like this can be worrying, but this does not mean that you have to avoid being intimate with your partner. You can still have close contact with your partner and continue to enjoy sex.  Animals If you have cats or birds, we just ask that you not change the litter or change the cage.  Please have someone else do this for you while you are on chemotherapy.   Food Safety During and After Cancer Treatment Food safety is important for people both during and after cancer treatment. Cancer and cancer treatments, such as chemotherapy, radiation therapy, and stem cell/bone marrow transplantation, often weaken the immune system. This makes it harder for your body to protect itself from foodborne illness, also called food poisoning.  Foodborne illness is caused by eating food that contains harmful bacteria, parasites, or viruses.  Foods to avoid Some foods have a higher risk of becoming tainted with bacteria. These include: Marland Kitchen Unwashed fresh fruit and  vegetables, especially leafy vegetables that can hide dirt and other contaminants . Raw sprouts, such as alfalfa sprouts . Raw or undercooked beef, especially ground beef, or other raw or undercooked meat and poultry . Fatty, fried, or spicy foods immediately before or after treatment.  These can sit heavy on your stomach and make you feel nauseous. . Raw or undercooked shellfish, such as oysters. . Sushi and sashimi, which often contain raw fish.  . Unpasteurized beverages, such as unpasteurized fruit juices, raw milk, raw yogurt, or cider . Undercooked eggs, such as soft boiled, over easy, and poached; raw, unpasteurized eggs; or foods made with raw egg, such as homemade raw cookie dough and homemade mayonnaise Simple steps for food safety Shop smart. . Do not buy food stored or displayed in an unclean area. . Do not buy bruised or damaged fruits or vegetables. . Do not buy cans that have cracks, dents, or bulges. . Pick up foods that can spoil at the end of your shopping trip and store them in a cooler on the way home. Prepare and clean up foods carefully. . Rinse all fresh fruits and vegetables under running water, and dry them with a clean towel or paper towel. . Clean the top of cans before opening them. . After preparing food, wash your hands for 20 seconds with hot water and soap. Pay special attention to areas between fingers and under nails. . Clean your utensils and dishes with hot water and soap. Marland Kitchen Disinfect your kitchen and cutting boards using 1 teaspoon of liquid, unscented bleach mixed into 1 quart of water.   Dispose of old food. . Eat canned and packaged food before its expiration date (the "use by" or "best before" date). . Consume refrigerated leftovers within 3 to 4 days. After that time, throw out the food. Even if the food does not smell or look spoiled, it still may be unsafe. Some bacteria, such as Listeria, can grow even on foods stored in the refrigerator if they are  kept for too long. Take precautions when eating out. . At restaurants, avoid buffets and salad bars where food sits out for a long time and comes in contact with many people. Food can become contaminated when someone with a virus, often a norovirus, or another "bug" handles it. . Put any leftover food in a "to-go" container yourself, rather than having the server do it. And, refrigerate leftovers as soon as you get home. . Choose restaurants that are clean and that are willing to prepare your food as you order it cooked.    MEDICATIONS: Zofran/Ondansetron 8mg  tablet. Take 1 tablet every 8 hours as needed for nausea/vomiting. (#1 nausea med to take, this can constipate)  Compazine/Prochlorperazine 10mg  tablet. Take 1 tablet every 6 hours as needed for nausea/vomiting. (#2 nausea med to take, this can make you sleepy)  Over-the-Counter Meds:  Miralax 1 capful in 8 oz of fluid daily. May increase to two times a day if needed. This is a stool softener. If this does not work, proceed you can add:  Senokot S-start with 1 tablet two times a day and increase to 4 tablets two times a day if needed. (total of 8 tablets in a 24 hour period). This is a stimulant laxative.   Call  us if this does not help your bowels move.   Imodium 2mg  capsule. Take 2 capsules after the 1st loose stool and then 1 capsule every 2 hours until you go a total of 12 hours without having a loose stool. Call the [facility] if loose stools continue. If diarrhea occurs @ bedtime, take 2 capsules @ bedtime. Then take 2 capsules every 4 hours until morning. Call [facility].    Diarrhea Sheet  If you are having loose stools/diarrhea, please purchase Imodium and begin taking as outlined:  At the first sign of poorly formed or loose stools, you should begin taking Imodium (loperamide) 2 mg capsules.  Take 2 caplets (4mg ) followed by one caplet (2mg ) every 2 hours until you have had no diarrhea for 12 hours.  During the night  take two caplets (4mg ) at bedtime and continue every 4 hours during the night until the morning.  Stop taking Imodium only after there is no sign of diarrhea for 12 hours.    Always call the [facility] if you are having loose stools/diarrhea that you cannot get under control.  Loose stools/diarrhea leads to dehydration (loss of water) in your body.  We have other options of trying to get the loose stools/diarrhea to stop but you must let us know!   Constipation Sheet *Miralax in 8 oz of fluid daily.  May increase to two times a day if needed.  This is a stool softener.  If this not enough to keep your bowel regular:  You can add:  *Senokot S, start with one tablet twice a day and can increase to 4 tablets twice a day if needed.  This is a stimulant laxative.   Sometimes when you take pain medication, you need BOTH a medicine to keep your stool soft and a medicine to help your bowel push it out!  Please call if the above does not work for you.   Do not go more than 2 days without a bowel movement.  It is very important that you do not become constipated.  It will make you feel sick to your stomach (nausea) and can cause abdominal pain and vomiting.    Nausea Sheet  Zofran/Ondansetron 8mg  tablet. Take 1 tablet every 8 hours as needed for nausea/vomiting. (#1 nausea med to take, this can constipate)  Compazine/Prochlorperazine 10mg  tablet. Take 1 tablet every 6 hours as needed for nausea/vomiting. (#2 nausea med to take, this can make you sleepy)  You can take these medications together or separately.  We would first like you to try the Ondansetron by itself and then take the Prochloperizine if needed. However, you can to take both medications at the same time if your nausea is that severe.  If you are having persistent nausea (nausea that does not stop) please take these medications on a staggered schedule so that the nausea medication stays in your body.  Please call the [facility] and  let us know the amount of nausea that you are experiencing.  If you begin to vomit, you need to call the [facility] and if it is the weekend and you have vomited more than one time and cannot get it to stop-go to the Emergency Room.  Persistent nausea/vomiting can lead to dehydration (loss of fluid in your body) and will make you feel terrible.   Ice chips, sips of clear liquids, foods that are @ room temperature, crackers, and toast tend to be better tolerated.    SYMPTOMS TO REPORT  AS SOON AS POSSIBLE AFTER TREATMENT:  FEVER GREATER THAN 100.5 F  CHILLS WITH OR WITHOUT FEVER  NAUSEA AND VOMITING THAT IS NOT CONTROLLED WITH YOUR NAUSEA MEDICATION  UNUSUAL SHORTNESS OF BREATH  UNUSUAL BRUISING OR BLEEDING  TENDERNESS IN MOUTH AND THROAT WITH OR WITHOUT PRESENCE OF ULCERS  URINARY PROBLEMS  BOWEL PROBLEMS  UNUSUAL RASH    Wear comfortable clothing and clothing appropriate for easy access to any Portacath or PICC line. Let us know if there is anything that we can do to make your therapy better!    What to do if you need assistance after hours or on the weekends: CALL [phone].  HOLD on the line, do not hang up.  You will hear multiple messages but at the end, you will be connected with a nurse triage line.  They will contact the doctor if necessary.  Most of the time they will be able to assist you.  Do not call the hospital operator.     I have been informed and understand all of the instructions given to me and have received a copy. I have been instructed to call the clinic [phone] or my family physician as soon as possible for continued medical care, if indicated. I do not have any more questions at this time but understand that I may call the [facility] or the Patient Navigator at [phone] during office hours should I have questions or need assistance in obtaining follow-up care.

## 2017-09-23 ENCOUNTER — Inpatient Hospital Stay (HOSPITAL_COMMUNITY): Payer: Medicare Other

## 2017-09-23 ENCOUNTER — Encounter (HOSPITAL_COMMUNITY): Payer: Self-pay

## 2017-09-23 ENCOUNTER — Other Ambulatory Visit: Payer: Self-pay

## 2017-09-23 ENCOUNTER — Telehealth (HOSPITAL_COMMUNITY): Payer: Self-pay | Admitting: Emergency Medicine

## 2017-09-23 ENCOUNTER — Other Ambulatory Visit (HOSPITAL_COMMUNITY): Payer: Self-pay | Admitting: Pharmacist

## 2017-09-23 VITALS — BP 111/49 | HR 98 | Temp 97.7°F | Resp 24 | Wt 166.3 lb

## 2017-09-23 DIAGNOSIS — C3490 Malignant neoplasm of unspecified part of unspecified bronchus or lung: Secondary | ICD-10-CM

## 2017-09-23 DIAGNOSIS — R531 Weakness: Secondary | ICD-10-CM | POA: Diagnosis not present

## 2017-09-23 DIAGNOSIS — I10 Essential (primary) hypertension: Secondary | ICD-10-CM | POA: Diagnosis not present

## 2017-09-23 DIAGNOSIS — Z5111 Encounter for antineoplastic chemotherapy: Secondary | ICD-10-CM | POA: Diagnosis present

## 2017-09-23 LAB — CBC WITH DIFFERENTIAL/PLATELET
Basophils Absolute: 0 10*3/uL (ref 0.0–0.1)
Basophils Relative: 0 %
Eosinophils Absolute: 0.4 10*3/uL (ref 0.0–0.7)
Eosinophils Relative: 3 %
HEMATOCRIT: 29.6 % — AB (ref 39.0–52.0)
HEMOGLOBIN: 9.8 g/dL — AB (ref 13.0–17.0)
LYMPHS PCT: 12 %
Lymphs Abs: 1.6 10*3/uL (ref 0.7–4.0)
MCH: 29.5 pg (ref 26.0–34.0)
MCHC: 33.1 g/dL (ref 30.0–36.0)
MCV: 89.2 fL (ref 78.0–100.0)
MONO ABS: 1.4 10*3/uL — AB (ref 0.1–1.0)
MONOS PCT: 11 %
NEUTROS ABS: 9.8 10*3/uL — AB (ref 1.7–7.7)
NEUTROS PCT: 74 %
Platelets: 398 10*3/uL (ref 150–400)
RBC: 3.32 MIL/uL — ABNORMAL LOW (ref 4.22–5.81)
RDW: 12.8 % (ref 11.5–15.5)
WBC: 13.1 10*3/uL — ABNORMAL HIGH (ref 4.0–10.5)

## 2017-09-23 LAB — COMPREHENSIVE METABOLIC PANEL
ALK PHOS: 93 U/L (ref 38–126)
ALT: 14 U/L — AB (ref 17–63)
ANION GAP: 11 (ref 5–15)
AST: 18 U/L (ref 15–41)
Albumin: 2.9 g/dL — ABNORMAL LOW (ref 3.5–5.0)
BILIRUBIN TOTAL: 0.6 mg/dL (ref 0.3–1.2)
BUN: 17 mg/dL (ref 6–20)
CALCIUM: 9.2 mg/dL (ref 8.9–10.3)
CO2: 27 mmol/L (ref 22–32)
CREATININE: 0.61 mg/dL (ref 0.61–1.24)
Chloride: 87 mmol/L — ABNORMAL LOW (ref 101–111)
GFR calc non Af Amer: >60 mL/min (ref >60)
Glucose, Bld: 117 mg/dL — ABNORMAL HIGH (ref 65–99)
Potassium: 4.3 mmol/L (ref 3.5–5.1)
SODIUM: 125 mmol/L — AB (ref 135–145)
TOTAL PROTEIN: 7.1 g/dL (ref 6.5–8.1)

## 2017-09-23 LAB — LACTATE DEHYDROGENASE: LDH: 134 U/L (ref 98–192)

## 2017-09-23 MED ORDER — SODIUM CHLORIDE 0.9 % IV SOLN: 10.0000 mg | Freq: Once | INTRAVENOUS | Status: DC

## 2017-09-23 MED ORDER — SODIUM CHLORIDE 0.9% FLUSH: 10.0000 mL | INTRAVENOUS | Status: DC | PRN

## 2017-09-23 MED ORDER — PACLITAXEL PROTEIN-BOUND CHEMO INJECTION 100 MG
100.0000 mg/m2 | Freq: Once | Status: AC
  Administered 2017-09-23: 200 mg via INTRAVENOUS
  Filled 2017-09-23: qty 40

## 2017-09-23 MED ORDER — PALONOSETRON HCL INJECTION 0.25 MG/5ML
0.2500 mg | Freq: Once | INTRAVENOUS | Status: AC
  Administered 2017-09-23: 0.25 mg via INTRAVENOUS

## 2017-09-23 MED ORDER — SODIUM CHLORIDE FLUSH 0.9 % IV SOLN: 10.0000 mL | Freq: Every day | INTRAVENOUS | 3 refills | Status: DC

## 2017-09-23 MED ORDER — SODIUM CHLORIDE 0.9 % IV SOLN
Freq: Once | INTRAVENOUS | Status: AC
  Administered 2017-09-23: 11:00:00 via INTRAVENOUS

## 2017-09-23 MED ORDER — SODIUM CHLORIDE 0.9 % IV SOLN
543.0000 mg | Freq: Once | INTRAVENOUS | Status: AC
  Administered 2017-09-23: 540 mg via INTRAVENOUS
  Filled 2017-09-23: qty 54

## 2017-09-23 MED ORDER — PALONOSETRON HCL INJECTION 0.25 MG/5ML
INTRAVENOUS | Status: AC
  Filled 2017-09-23: qty 5

## 2017-09-23 MED ORDER — DEXAMETHASONE SODIUM PHOSPHATE 10 MG/ML IJ SOLN
10.0000 mg | Freq: Once | INTRAMUSCULAR | Status: AC
  Administered 2017-09-23: 10 mg via INTRAVENOUS

## 2017-09-23 MED ORDER — DEXAMETHASONE SODIUM PHOSPHATE 10 MG/ML IJ SOLN
INTRAMUSCULAR | Status: AC
  Filled 2017-09-23: qty 1

## 2017-09-23 NOTE — Progress Notes (Signed)
Tolerated infusions w/o adverse reaction.  Alert, in no distress.  VSS.  Discharged via wheelchair in c/o spouse.

## 2017-09-23 NOTE — Telephone Encounter (Signed)
Spoke with Varney Biles in pathology to order Foundation one and PDL one on [id number].

## 2017-09-23 NOTE — Progress Notes (Signed)
Diagnosis Malignant neoplasm of bronchus and lung ([place]) - Plan: CT Head W Wo Contrast, CT Head W Wo Contrast  Staging Cancer Staging No matching staging information was found for the patient.  Assessment and PlanAssessment and Plan:1.  Stage IV non-small cell lung cancer.  The patient originally had an abnormal scan done in May 2018 which showed densities in the right upper lobe and posterior lateral left upper lobe.  Unclear why patient never had a biopsy performed based on his initial evaluation in May 2018. He had a PET scan done 02/02/2017 that showed IMPRESSION: 1. Mass within the posteromedial left upper lobe is suspicious for primary bronchogenic carcinoma. 2. Hypermetabolic left hilar, left paratracheal and right paratracheal lymph nodes worrisome for metastatic disease. 3. Perifissural nodularity within the posterior left upper lobe and parenchymal nodule is identified within the right upper lobe. Both of these exhibit mild increased FDG uptake. Cannot rule out metastatic disease. 4. Aortic Atherosclerosis (ICD10-I70.0). Ectatic abdominal aorta at risk for aneurysm development. Recommend followup by ultrasound in 5 years. This recommendation follows ACR consensus guidelines: White Paper of the ACR Incidental Findings Committee II on Vascular Findings. J Am Coll Radiol 2013; 10:789-794. 5. Multi vessel coronary artery calcification 6. Hyperdense lesion arising from inferior pole of left kidney is incompletely characterized without IV contrast. This may represent a hemorrhagic cyst, proteinaceous cyst or solid kidney lesion.   He subsequently had a CT of the chest done 08/26/2017 that showed interval increase in mediastinal LN suggestive of progression of malignancy.  There was also interval increase in the size of the process in the left lower lobe which was concerning for persistent malignancy.  There was a small nodule along the left oblique fissure.  There was also a small nodule  in the right horizontal fissure that was new.  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  Pt was set up for Pet scan for staging and Pet scan done 09/16/2017 shows IMPRESSION: 1. Progressive cavitary, hypermetabolic primary neoplasm medially in the left lower lobe. The separate small focus of hypermetabolic activity along the superior aspect of the left major fissure has not significantly changed. 2. 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 3. Progressive widespread hypermetabolic mediastinal lymphadenopathy. 4. No evidence of subdiaphragmatic or osseous metastatic disease. 5. Stable incidental findings including a left thyroid nodule and extensive Aortic Atherosclerosis (ICD10-I70.0).   Long talk was had with the patient and his family.  Goals of therapy reviewed.  Findings on PET consistent with progressive and advanced lung cancer.  He was recommended for MRI of the brain but did not have that performed.  He is willing to have CT of brain done today  09/17/2017 that shows no IC mets.  Due to worsening symptoms, will expedite treatment with Pem/Abraxane/Carboplatin based on results of Keynote- 407 trial in [facility] which showed improved OS, PFS, RR and duration of response.    Pembro will begin with C2 and is dosed 200 mg with Carboplatin AUC 6 every 3 weeks, Abraxane 100 mg/m2 weekly  Pt is planned for 4 cycles.  He will then be set up for repeat imaging and Pending results may continue Pembro.  Side effects of therapy were reviewed with the patient.  Will monitor TFTs as therapy proceeds.   Patient will return to clinic  for follow-up after C2 to assess for tolerance of therapy once Pembro begins.  Pt will be set up for chemotherapy  teaching.  All questions answered and pt and family expressed understanding of information presented.     2.  Hoarseness.  This is likely secondary to advanced squamous cell  lung cancer.  He denies any trouble swallowing but reports some minor shortness of breath. Will expedite therapy initiation.   3.  Hypertension.  Blood pressure is 101/55.    He should continue to follow with his primary care physician.  4.  COPD.  He has a long smoking history.  He is followed by Dr. Luan Pulling.  Interval History:  77 y.o. male with past medical history of COPD, chronic cough, HTN, HLD, who is followed by pulmonologist for lung nodule.   Patient underwent CTA Chest 5/8/218 who showed: 1.  No evidence for pulmonary embolism 2.  Enlarging nodular densities involving the anterior right upper lobe and posterior lateral left upper lobe.  3.  Irregular groundglass consolidation involving the posterior medial left lower lobe.  Pt had prior PET scan done 02/02/2017 that showed IMPRESSION: 1. Mass within the posteromedial left upper lobe is suspicious for primary bronchogenic carcinoma. 2. Hypermetabolic left hilar, left paratracheal and right paratracheal lymph nodes worrisome for metastatic disease. 3. Perifissural nodularity within the posterior left upper lobe and parenchymal nodule is identified within the right upper lobe. Both of these exhibit mild increased FDG uptake. Cannot rule out metastatic disease. 4. Aortic Atherosclerosis (ICD10-I70.0). Ectatic abdominal aorta at risk for aneurysm development. Recommend followup by ultrasound in 5 years. This recommendation follows ACR consensus guidelines: White Paper of the ACR Incidental Findings Committee II on Vascular Findings. J Am Coll Radiol 2013; 10:789-794. 5. Multi vessel coronary artery calcification 6. Hyperdense lesion arising from inferior pole of left kidney is incompletely characterized without IV contrast. This may represent a hemorrhagic cyst, proteinaceous cyst or solid kidney lesion.   IR consulted for lung mass biopsy at the request of Dr. Luan Pulling.  Case reviewed by Dr. Earleen Newport who has approved patient  for LLL mass biopsy.   Unclear if patient ever had a biopsy performed at his initial evaluation in 11/2016.  Patient had a  CT of the chest done on 08/26/2017 which showed interval increase in mediastinal lymph nodes concerning for progression of malignancy.  There was also interval increase in size of the process in the left lower lobe.  These were concerning for persistent malignancy.  There was also a small nodule along the left oblique fissure fissure.  There was also a small nodule on the right horizontal fissure that was new.  He underwent biopsy of the left lung lesion on 09/04/2017 that showed poorly differentiated squamous cell cancer.  He reports he has noted some hoarseness and  weight loss.     Current Status:  Pt is seen today for follow-up due to squamous cell carcinoma of the lung.  He is here to go over PET scan.  He did not have MRI brain done as previously scheduled.     Problem List Patient Active Problem List   Diagnosis Date Noted  . Squamous carcinoma of lung, unspecified laterality ([place]) [C34.90] 09/17/2017     Past Medical History Past Medical History:  Diagnosis Date  . Anxiety   . Bipolar disorder ([place])   . Cancer ([place])    skin cancer  . COPD (chronic obstructive pulmonary disease) ([place])   . GERD (gastroesophageal reflux disease)   . HOH (hard of hearing)   . Hypertension     Past Surgical History Past Surgical History:  Procedure Laterality  Date  . APPENDECTOMY    . CATARACT EXTRACTION, BILATERAL Bilateral   . FLEXIBLE BRONCHOSCOPY N/A 09/04/2017   Procedure: FLEXIBLE BRONCHOSCOPY WITH PROPOFOL;  Surgeon: Sinda Du, MD;  Location: [facility];  Service: Cardiopulmonary;  Laterality: N/A;  . SKIN CANCER EXCISION     hand    Family History Family History  Problem Relation Age of Onset  . Heart attack Mother   . Stroke Father   . Leukemia Brother   . Other Brother      Social History  reports that he has been smoking cigarettes.  He has a  60.00 pack-year smoking history. he has never used smokeless tobacco. He reports that he drinks alcohol. He reports that he does not use drugs.  Medications  Current Outpatient Medications:  .  albuterol (PROVENTIL HFA;VENTOLIN HFA) 108 (90 Base) MCG/ACT inhaler, Inhale 1 puff into the lungs every 6 (six) hours as needed for wheezing or shortness of breath., Disp: , Rfl:  .  amLODipine (NORVASC) 5 MG tablet, Take 5 mg by mouth daily., Disp: , Rfl:  .  atorvastatin (LIPITOR) 10 MG tablet, Take 10 mg by mouth daily. , Disp: , Rfl:  .  baclofen (LIORESAL) 10 MG tablet, Take 10 mg by mouth daily as needed for muscle spasms., Disp: , Rfl:  .  Capsaicin (CAPZASIN-HP EX), Apply 1 application topically daily as needed (pain)., Disp: , Rfl:  .  co-enzyme Q-10 30 MG capsule, Take 30 mg by mouth 2 (two) times daily., Disp: , Rfl:  .  DULoxetine (CYMBALTA) 60 MG capsule, Take 60 mg by mouth daily., Disp: , Rfl:  .  Fluticasone-Umeclidin-Vilant (TRELEGY ELLIPTA) 100-62.5-25 MCG/INH AEPB, Inhale 1 puff into the lungs at bedtime., Disp: , Rfl:  .  Guaifenesin 1200 MG TB12, Take 1,200 mg by mouth daily., Disp: , Rfl:  .  Krill Oil 500 MG CAPS, Take 500 mg by mouth at bedtime., Disp: , Rfl:  .  LORazepam (ATIVAN) 1 MG tablet, Take 1 pill by mouth the day of procedure about 30 minutes before you arrive., Disp: 1 tablet, Rfl: 0 .  losartan-hydrochlorothiazide (HYZAAR) 100-12.5 MG tablet, Take 1 tablet by mouth daily., Disp: , Rfl:  .  meloxicam (MOBIC) 15 MG tablet, Take 15 mg by mouth daily., Disp: , Rfl:  .  mirtazapine (REMERON) 15 MG tablet, Take 15 mg by mouth at bedtime., Disp: , Rfl:  .  Misc Natural Products (OSTEO BI-FLEX ADV TRIPLE ST PO), Take 1 tablet by mouth 2 (two) times daily., Disp: , Rfl:  .  Multiple Vitamin (MULTIVITAMIN WITH MINERALS) TABS tablet, Take 1 tablet by mouth daily. One-A-Day Men's 50+, Disp: , Rfl:  .  naproxen sodium (ALEVE) 220 MG tablet, Take 220 mg by mouth daily as needed  (pain)., Disp: , Rfl:  .  ranitidine (ZANTAC) 150 MG tablet, Take 150 mg by mouth 2 (two) times daily as needed for heartburn. , Disp: , Rfl:  .  CARBOPLATIN IV, Inject into the vein., Disp: , Rfl:  .  ondansetron (ZOFRAN) 8 MG tablet, Take 1 tablet (8 mg total) by mouth 2 (two) times daily as needed for refractory nausea / vomiting. Start on day 3 after carboplatin chemo., Disp: 30 tablet, Rfl: 1 .  PACLitaxel Protein-Bound Part (ABRAXANE IV), Inject into the vein., Disp: , Rfl:  .  Pembrolizumab (KEYTRUDA IV), Inject into the vein., Disp: , Rfl:  .  prochlorperazine (COMPAZINE) 10 MG tablet, Take 1 tablet (10 mg total) by mouth every  6 (six) hours as needed (Nausea or vomiting)., Disp: 30 tablet, Rfl: 1  Allergies Patient has no known allergies.  Review of Systems Review of Systems - Oncology ROS as per HPI otherwise 12 point ROS is negative other than weight loss, SOB and hoarness   Physical Exam  Vitals Wt Readings from Last 3 Encounters:  09/17/17 165 lb (74.8 kg)  09/11/17 165 lb 9.6 oz (75.1 kg)  09/01/17 166 lb (75.3 kg)   Temp Readings from Last 3 Encounters:  09/17/17 97.8 F (36.6 C) (Oral)  09/11/17 98.2 F (36.8 C) (Oral)  09/04/17 (!) 97.5 F (36.4 C) (Oral)   BP Readings from Last 3 Encounters:  09/17/17 (!) 101/55  09/11/17 110/61  09/04/17 139/62   Pulse Readings from Last 3 Encounters:  09/17/17 (!) 103  09/11/17 (!) 112  09/04/17 (!) 109   Constitutional: Well-developed, well-nourished, and in no distress.   HENT: Head: Normocephalic and atraumatic.  Mouth/Throat: No oropharyngeal exudate. Mucosa moist. Eyes: Pupils are equal, round, and reactive to light. Conjunctivae are normal. No scleral icterus.  Neck: Normal range of motion. Neck supple. No JVD present.  Cardiovascular: Normal rate, regular rhythm and normal heart sounds.  Exam reveals no gallop and no friction rub.   No murmur heard. Pulmonary/Chest: Effort normal.  Coarse breath sounds.    Abdominal: Soft. Bowel sounds are normal. No distension. There is no tenderness. There is no guarding.  Musculoskeletal: No edema or tenderness.  Lymphadenopathy: No cervical, axillary or supraclavicular adenopathy.  Neurological: Alert and oriented to person, place, and time. No cranial nerve deficit.  Skin: Skin is warm and dry. No rash noted. No erythema. No pallor.  Psychiatric: Affect and judgment normal.   [facility] Outpatient Visit on 09/16/2017  Component Date Value Ref Range Status  . Glucose-Capillary 09/16/2017 97  65 - 99 mg/dL Final     Orders Placed This Encounter  Procedures  . CT Head W Wo Contrast    Standing Status:   Future    Number of Occurrences:   1    Standing Expiration Date:   09/17/2018    Order Specific Question:   If indicated for the ordered procedure, I authorize the administration of contrast media per Radiology protocol    Answer:   Yes    Order Specific Question:   Preferred imaging location?    Answer:   [facility]    Order Specific Question:   Radiology Contrast Protocol - do NOT remove file path    Answer:   \\[email or web address]    Order Specific Question:   Reason for Exam additional comments    Answer:   Lung cancer advanced,  Pt refused MRI.  Evaluate for brain mets  . CT Head W Wo Contrast    Standing Status:   Future    Standing Expiration Date:   09/17/2018    Order Specific Question:   If indicated for the ordered procedure, I authorize the administration of contrast media per Radiology protocol    Answer:   Yes    Order Specific Question:   Preferred imaging location?    Answer:   [facility]    Order Specific Question:   Radiology Contrast Protocol - do NOT remove file path    Answer:   \\[email or web address]    Order Specific Question:   Reason for Exam additional comments    Answer:   Lung cancer advanced,  Pt refused MRI.  Evaluate for brain mets; Non-small cell  lung cancer, stage IIIB/IV/M1b (T1-4, N2-3), pre-treatment eval   Pet scan done 09/16/2017:  IMPRESSION: 1. Progressive cavitary, hypermetabolic primary neoplasm medially in the left lower lobe. The separate small focus of hypermetabolic activity along the superior aspect of the left major fissure has not significantly changed. 2. 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 3. Progressive widespread hypermetabolic mediastinal lymphadenopathy. 4. No evidence of subdiaphragmatic or osseous metastatic disease. 5. Stable incidental findings including a left thyroid nodule and extensive Aortic Atherosclerosis (ICD10-I70.0).     Zoila Shutter MD

## 2017-09-23 NOTE — Progress Notes (Signed)
Chemotherapy teaching completed.  Consent signed.  Extensive teaching packet given.     Pts wife feels comfortable flushing the PICC line.  RN will teach the wife how to flush the PICC before pt is discharge from the clinic today.  Called [facility] to let them know that we would not need their HH.  They could not come out only to flush his PICC line daily, they would need some other support to come out to bill him for.  10 mL NS syringes called into [employer] in [place].

## 2017-09-24 ENCOUNTER — Telehealth (HOSPITAL_COMMUNITY): Payer: Self-pay | Admitting: *Deleted

## 2017-09-24 NOTE — Telephone Encounter (Signed)
Voicemail message left for pt for 24 hour f/u after first chemotherapy tx.  Instructed to call the clinic with any questions or concerns.

## 2017-09-25 ENCOUNTER — Telehealth (HOSPITAL_COMMUNITY): Payer: Self-pay

## 2017-09-25 MED ORDER — SODIUM CHLORIDE FLUSH 0.9 % IV SOLN: 10.0000 mL | INTRAVENOUS | 3 refills | Status: AC

## 2017-09-25 NOTE — Telephone Encounter (Signed)
Received call from patients wife that [facility] will not order the filled NS syringes patient needs to flush his PICC line. She states that [facility] can get them and wants the prescription to go there. Reviewed with provider. New prescription for NS flushes sent to [facility].

## 2017-09-29 ENCOUNTER — Telehealth (HOSPITAL_COMMUNITY): Payer: Self-pay

## 2017-09-29 NOTE — Telephone Encounter (Signed)
Patients wife called and said patients PICC line purple port will not flush. She states she called yesterday and spoke with Ebony Hail. Mrs.Monger states she has tried everything that Ebony Hail told her to do. The red port flushes fine. He has appt. tomorrow and will check PICC line then.

## 2017-09-30 ENCOUNTER — Other Ambulatory Visit (HOSPITAL_COMMUNITY): Payer: Medicare Other

## 2017-09-30 ENCOUNTER — Inpatient Hospital Stay (HOSPITAL_COMMUNITY): Payer: Medicare Other

## 2017-09-30 ENCOUNTER — Encounter (HOSPITAL_COMMUNITY): Payer: Self-pay

## 2017-09-30 ENCOUNTER — Inpatient Hospital Stay (HOSPITAL_BASED_OUTPATIENT_CLINIC_OR_DEPARTMENT_OTHER): Payer: Medicare Other | Admitting: Internal Medicine

## 2017-09-30 VITALS — BP 93/46 | HR 90 | Temp 97.8°F | Resp 20 | Wt 159.9 lb

## 2017-09-30 DIAGNOSIS — C3490 Malignant neoplasm of unspecified part of unspecified bronchus or lung: Secondary | ICD-10-CM

## 2017-09-30 DIAGNOSIS — R059 Cough, unspecified: Secondary | ICD-10-CM

## 2017-09-30 DIAGNOSIS — D649 Anemia, unspecified: Secondary | ICD-10-CM

## 2017-09-30 DIAGNOSIS — R05 Cough: Secondary | ICD-10-CM

## 2017-09-30 DIAGNOSIS — R0600 Dyspnea, unspecified: Secondary | ICD-10-CM

## 2017-09-30 DIAGNOSIS — Z5111 Encounter for antineoplastic chemotherapy: Secondary | ICD-10-CM | POA: Diagnosis not present

## 2017-09-30 LAB — CBC WITH DIFFERENTIAL/PLATELET
BASOS PCT: 0 %
Basophils Absolute: 0 10*3/uL (ref 0.0–0.1)
EOS ABS: 0 10*3/uL (ref 0.0–0.7)
Eosinophils Relative: 0 %
HCT: 23.7 % — ABNORMAL LOW (ref 39.0–52.0)
HEMOGLOBIN: 7.7 g/dL — AB (ref 13.0–17.0)
Lymphocytes Relative: 11 %
Lymphs Abs: 1.1 10*3/uL (ref 0.7–4.0)
MCH: 28.9 pg (ref 26.0–34.0)
MCHC: 32.5 g/dL (ref 30.0–36.0)
MCV: 89.1 fL (ref 78.0–100.0)
MONO ABS: 1.3 10*3/uL — AB (ref 0.1–1.0)
MONOS PCT: 13 %
NEUTROS PCT: 76 %
Neutro Abs: 7.8 10*3/uL — ABNORMAL HIGH (ref 1.7–7.7)
Platelets: 260 10*3/uL (ref 150–400)
RBC: 2.66 MIL/uL — ABNORMAL LOW (ref 4.22–5.81)
RDW: 12.8 % (ref 11.5–15.5)
WBC: 10.3 10*3/uL (ref 4.0–10.5)

## 2017-09-30 LAB — COMPREHENSIVE METABOLIC PANEL
ALK PHOS: 97 U/L (ref 38–126)
ALT: 16 U/L — ABNORMAL LOW (ref 17–63)
AST: 21 U/L (ref 15–41)
Albumin: 2.5 g/dL — ABNORMAL LOW (ref 3.5–5.0)
Anion gap: 12 (ref 5–15)
BILIRUBIN TOTAL: 0.9 mg/dL (ref 0.3–1.2)
BUN: 29 mg/dL — ABNORMAL HIGH (ref 6–20)
CALCIUM: 9.2 mg/dL (ref 8.9–10.3)
CO2: 26 mmol/L (ref 22–32)
Chloride: 89 mmol/L — ABNORMAL LOW (ref 101–111)
Creatinine, Ser: 1.02 mg/dL (ref 0.61–1.24)
GFR calc non Af Amer: >60 mL/min (ref >60)
Glucose, Bld: 130 mg/dL — ABNORMAL HIGH (ref 65–99)
Potassium: 3.5 mmol/L (ref 3.5–5.1)
SODIUM: 127 mmol/L — AB (ref 135–145)
TOTAL PROTEIN: 6.7 g/dL (ref 6.5–8.1)

## 2017-09-30 LAB — PREPARE RBC (CROSSMATCH)

## 2017-09-30 LAB — LACTATE DEHYDROGENASE: LDH: 142 U/L (ref 98–192)

## 2017-09-30 LAB — ABO/RH: ABO/RH(D): B NEG

## 2017-09-30 MED ORDER — SODIUM CHLORIDE 0.9 % IV SOLN
1.0000 g | Freq: Once | INTRAVENOUS | Status: AC
  Administered 2017-09-30: 1 g via INTRAVENOUS
  Filled 2017-09-30: qty 1

## 2017-09-30 MED ORDER — PROCHLORPERAZINE MALEATE 10 MG PO TABS
ORAL_TABLET | ORAL | Status: AC
  Filled 2017-09-30: qty 1

## 2017-09-30 MED ORDER — SODIUM CHLORIDE 0.9 % IV SOLN
Freq: Once | INTRAVENOUS | Status: AC
  Administered 2017-09-30: 14:00:00 via INTRAVENOUS

## 2017-09-30 MED ORDER — HEPARIN SOD (PORK) LOCK FLUSH 100 UNIT/ML IV SOLN
250.0000 [IU] | Freq: Once | INTRAVENOUS | Status: AC | PRN
  Administered 2017-09-30: 250 [IU]

## 2017-09-30 MED ORDER — SODIUM CHLORIDE 0.9% FLUSH
3.0000 mL | INTRAVENOUS | Status: DC | PRN
  Administered 2017-09-30: 3 mL
  Filled 2017-09-30: qty 10

## 2017-09-30 MED ORDER — PROCHLORPERAZINE MALEATE 10 MG PO TABS
10.0000 mg | ORAL_TABLET | Freq: Once | ORAL | Status: AC
  Administered 2017-09-30: 10 mg via ORAL

## 2017-09-30 MED ORDER — PACLITAXEL PROTEIN-BOUND CHEMO INJECTION 100 MG
100.0000 mg/m2 | Freq: Once | Status: AC
  Administered 2017-09-30: 200 mg via INTRAVENOUS
  Filled 2017-09-30: qty 40

## 2017-09-30 MED ORDER — SODIUM CHLORIDE 0.9 % IV SOLN
20.0000 mg | Freq: Once | INTRAVENOUS | Status: AC
  Administered 2017-09-30: 20 mg via INTRAVENOUS
  Filled 2017-09-30: qty 2

## 2017-09-30 NOTE — Patient Instructions (Signed)
[place] at [facility] Discharge Instructions   You were seen today by Dr. Zoila Shutter She discussed how you are feeling right now and how the treatment has caused you to feel. She discussed your labs and your hemoglobin was low, you may benefit from a blood transfusion. We can get you scheduled tomorrow for the blood transfusion. Continue using the Imodium for your diarrhea that you have.  Continue using Ensure and Boost to help nutrition. Return as scheduled.   Thank you for choosing [place] at [facility] to provide your oncology and hematology care.  To afford each patient quality time with our provider, please arrive at least 15 minutes before your scheduled appointment time.    If you have a lab appointment with the [facility] please come in thru the  Main Entrance and check in at the main information desk  You need to re-schedule your appointment should you arrive 10 or more minutes late.  We strive to give you quality time with our providers, and arriving late affects you and other patients whose appointments are after yours.  Also, if you no show three or more times for appointments you may be dismissed from the clinic at the providers discretion.     Again, thank you for choosing [facility].  Our hope is that these requests will decrease the amount of time that you wait before being seen by our physicians.       _____________________________________________________________  Should you have questions after your visit to [facility], please contact our office at [phone] between the hours of 8:30 a.m. and 4:30 p.m.  Voicemails left after 4:30 p.m. will not be returned until the following business day.  For prescription refill requests, have your pharmacy contact our office.       Resources For Cancer Patients and their Caregivers ? American Cancer Society: Can assist with transportation,  wigs, general needs, runs Look Good Feel Better.        212 674 3278 ? Cancer Care: Provides financial assistance, online support groups, medication/co-pay assistance.  1-800-813-HOPE 678-004-5736) ? [facility] Assists [place] cancer patients and their families through emotional , educational and financial support.  9030465803 ? [place] DSS Where to apply for food stamps, Medicaid and utility assistance. (432)158-4355 ? [employer]: Transportation to medical appointments. (918) 373-7188 ? Social Security Administration: May apply for disability if have a Stage IV cancer. 709-219-9145 424-632-3763 ? [employer], Disability and Transit Services: Assists with nutrition, care and transit needs. [facility] Support Programs:   > Cancer Support Group  2nd Tuesday of the month 1pm-2pm, Journey Room   > Creative Journey  3rd Tuesday of the month 1130am-1pm, Journey Room

## 2017-09-30 NOTE — Patient Instructions (Signed)
[facility] Discharge Instructions for Patients Receiving Chemotherapy   Beginning January 23rd 2017 lab work for the [facility] will be done in the  Main lab at [facility] on 1st floor. If you have a lab appointment with the [facility] please come in thru the  Main Entrance and check in at the main information desk   Today you received the following chemotherapy agents Abraxane as well as Rocephin infusion. Follow-up as scheduled. Call clinic for any questions or concerns  To help prevent nausea and vomiting after your treatment, we encourage you to take your nausea medication   If you develop nausea and vomiting, or diarrhea that is not controlled by your medication, call the clinic.  The clinic phone number is [phone]. Office hours are Monday-Friday 8:30am-5:00pm.  BELOW ARE SYMPTOMS THAT SHOULD BE REPORTED IMMEDIATELY:  *FEVER GREATER THAN 101.0 F  *CHILLS WITH OR WITHOUT FEVER  NAUSEA AND VOMITING THAT IS NOT CONTROLLED WITH YOUR NAUSEA MEDICATION  *UNUSUAL SHORTNESS OF BREATH  *UNUSUAL BRUISING OR BLEEDING  TENDERNESS IN MOUTH AND THROAT WITH OR WITHOUT PRESENCE OF ULCERS  *URINARY PROBLEMS  *BOWEL PROBLEMS  UNUSUAL RASH Items with * indicate a potential emergency and should be followed up as soon as possible. If you have an emergency after office hours please contact your primary care physician or go to the nearest emergency department.  Please call the clinic during office hours if you have any questions or concerns.   You may also contact the Patient Navigator at [phone] should you have any questions or need assistance in obtaining follow up care.      Resources For Cancer Patients and their Caregivers ? American Cancer Society: Can assist with transportation, wigs, general needs, runs Look Good Feel Better.        2812044136 ? Cancer Care: Provides financial assistance, online support groups, medication/co-pay  assistance.  1-800-813-HOPE (848) 483-2081) ? [facility] Assists [place] cancer patients and their families through emotional , educational and financial support.  262-520-4117 ? [place] DSS Where to apply for food stamps, Medicaid and utility assistance. 619 374 6124 ? [employer]: Transportation to medical appointments. 2518375262 ? Social Security Administration: May apply for disability if have a Stage IV cancer. 218 757 6461 808-237-2910 ? [employer], Disability and Transit Services: Assists with nutrition, care and transit needs. 936-161-1034

## 2017-09-30 NOTE — Progress Notes (Signed)
1400 Labs reviewed with and pt seen by Dr. Walden Field today and pt approved for chemo tx today as well as Rocephin infusion and blood transfusion tomorrow per MD orders                                                                Danny Booker tolerated Abraxane and Rocephin infusion well without complaints or incident. PICC line dressing and caps changed and flushed per protocol. VSS upon discharge. Pt discharged via wheelchair in stable condition accompanied by family members

## 2017-10-01 ENCOUNTER — Inpatient Hospital Stay (HOSPITAL_COMMUNITY): Payer: Medicare Other

## 2017-10-01 DIAGNOSIS — D649 Anemia, unspecified: Secondary | ICD-10-CM

## 2017-10-01 DIAGNOSIS — Z5111 Encounter for antineoplastic chemotherapy: Secondary | ICD-10-CM | POA: Diagnosis not present

## 2017-10-01 DIAGNOSIS — C3490 Malignant neoplasm of unspecified part of unspecified bronchus or lung: Secondary | ICD-10-CM

## 2017-10-01 MED ORDER — ACETAMINOPHEN 325 MG PO TABS
ORAL_TABLET | ORAL | Status: AC
  Filled 2017-10-01: qty 2

## 2017-10-01 MED ORDER — SODIUM CHLORIDE 0.9% FLUSH: 10.0000 mL | INTRAVENOUS | Status: DC | PRN

## 2017-10-01 MED ORDER — DIPHENHYDRAMINE HCL 25 MG PO CAPS
ORAL_CAPSULE | ORAL | Status: AC
  Filled 2017-10-01: qty 1

## 2017-10-01 MED ORDER — ACETAMINOPHEN 325 MG PO TABS
650.0000 mg | ORAL_TABLET | Freq: Once | ORAL | Status: AC
  Administered 2017-10-01: 650 mg via ORAL

## 2017-10-01 MED ORDER — DIPHENHYDRAMINE HCL 25 MG PO CAPS
25.0000 mg | ORAL_CAPSULE | Freq: Once | ORAL | Status: AC
  Administered 2017-10-01: 25 mg via ORAL

## 2017-10-01 MED ORDER — SODIUM CHLORIDE 0.9 % IV SOLN
250.0000 mL | Freq: Once | INTRAVENOUS | Status: AC
  Administered 2017-10-01: 250 mL via INTRAVENOUS

## 2017-10-01 NOTE — Progress Notes (Signed)
Tolerated transfusion w/o adverse reaction.  Alert, in no distress.  VSS.  Discharged via wheelchair in c/o family.

## 2017-10-04 LAB — TYPE AND SCREEN
ABO/RH(D): B NEG
ANTIBODY SCREEN: NEGATIVE
UNIT DIVISION: 0
Unit division: 0

## 2017-10-04 LAB — BPAM RBC
Blood Product Expiration Date: 201904022359
Blood Product Expiration Date: 201904062359
ISSUE DATE / TIME: 201903141053
Unit Type and Rh: 9500
Unit Type and Rh: 9500

## 2017-10-05 ENCOUNTER — Telehealth (HOSPITAL_COMMUNITY): Payer: Self-pay | Admitting: Emergency Medicine

## 2017-10-05 NOTE — Telephone Encounter (Signed)
Wife calling stating that pt is having pain in is lungs and rating the pain 5/10.  States that this is new for him.  States that he is SOB of breath but this could be from the pain that he is in.  Pt has not had any fevers/chills.  Pt has history of hemorrhoids, but wife states that has been lot more bright red blood in the toilet than normal when pt has bowel movement.  Explained that she should bring patient into the ER to be evaluated.  Spoke with the patient and he does not want to come into the ER.  He is going to lay down for a few hours first.  Pt has taken aleve for pain.  He has agreed that if he does not feel better when he wakes up that he will come to the ER for evaluation.  Discussed with Dr Walden Field and this not has been routed to Dr Walden Field as well.

## 2017-10-07 ENCOUNTER — Inpatient Hospital Stay (HOSPITAL_COMMUNITY): Payer: Medicare Other

## 2017-10-07 ENCOUNTER — Encounter (HOSPITAL_COMMUNITY): Payer: Self-pay

## 2017-10-07 VITALS — BP 94/52 | HR 90 | Temp 97.6°F | Resp 22 | Ht 71.0 in | Wt 156.6 lb

## 2017-10-07 DIAGNOSIS — C3490 Malignant neoplasm of unspecified part of unspecified bronchus or lung: Secondary | ICD-10-CM

## 2017-10-07 DIAGNOSIS — Z5111 Encounter for antineoplastic chemotherapy: Secondary | ICD-10-CM | POA: Diagnosis not present

## 2017-10-07 LAB — CBC WITH DIFFERENTIAL/PLATELET
BASOS ABS: 0 10*3/uL (ref 0.0–0.1)
BASOS PCT: 0 %
Eosinophils Absolute: 0 10*3/uL (ref 0.0–0.7)
Eosinophils Relative: 0 %
HEMATOCRIT: 26.5 % — AB (ref 39.0–52.0)
HEMOGLOBIN: 8.7 g/dL — AB (ref 13.0–17.0)
Lymphocytes Relative: 32 %
Lymphs Abs: 1.2 10*3/uL (ref 0.7–4.0)
MCH: 30 pg (ref 26.0–34.0)
MCHC: 32.8 g/dL (ref 30.0–36.0)
MCV: 91.4 fL (ref 78.0–100.0)
MONOS PCT: 18 %
Monocytes Absolute: 0.7 10*3/uL (ref 0.1–1.0)
NEUTROS ABS: 1.9 10*3/uL (ref 1.7–7.7)
NEUTROS PCT: 50 %
Platelets: 243 10*3/uL (ref 150–400)
RBC: 2.9 MIL/uL — AB (ref 4.22–5.81)
RDW: 13.1 % (ref 11.5–15.5)
WBC: 3.9 10*3/uL — AB (ref 4.0–10.5)

## 2017-10-07 LAB — COMPREHENSIVE METABOLIC PANEL
ALBUMIN: 2.7 g/dL — AB (ref 3.5–5.0)
ALK PHOS: 118 U/L (ref 38–126)
ALT: 35 U/L (ref 17–63)
AST: 26 U/L (ref 15–41)
Anion gap: 12 (ref 5–15)
BILIRUBIN TOTAL: 0.5 mg/dL (ref 0.3–1.2)
BUN: 19 mg/dL (ref 6–20)
CALCIUM: 9.2 mg/dL (ref 8.9–10.3)
CO2: 29 mmol/L (ref 22–32)
Chloride: 88 mmol/L — ABNORMAL LOW (ref 101–111)
Creatinine, Ser: 0.64 mg/dL (ref 0.61–1.24)
GFR calc Af Amer: >60 mL/min (ref >60)
GFR calc non Af Amer: >60 mL/min (ref >60)
GLUCOSE: 119 mg/dL — AB (ref 65–99)
Potassium: 3.8 mmol/L (ref 3.5–5.1)
Sodium: 129 mmol/L — ABNORMAL LOW (ref 135–145)
TOTAL PROTEIN: 6.7 g/dL (ref 6.5–8.1)

## 2017-10-07 LAB — LACTATE DEHYDROGENASE: LDH: 138 U/L (ref 98–192)

## 2017-10-07 MED ORDER — HEPARIN SOD (PORK) LOCK FLUSH 100 UNIT/ML IV SOLN
250.0000 [IU] | Freq: Once | INTRAVENOUS | Status: AC | PRN
  Administered 2017-10-07: 250 [IU]
  Filled 2017-10-07: qty 5

## 2017-10-07 MED ORDER — PROCHLORPERAZINE MALEATE 10 MG PO TABS
10.0000 mg | ORAL_TABLET | Freq: Once | ORAL | Status: AC
  Administered 2017-10-07: 10 mg via ORAL

## 2017-10-07 MED ORDER — SODIUM CHLORIDE 0.9 % IV SOLN
Freq: Once | INTRAVENOUS | Status: AC
  Administered 2017-10-07: 13:00:00 via INTRAVENOUS

## 2017-10-07 MED ORDER — HEPARIN SOD (PORK) LOCK FLUSH 100 UNIT/ML IV SOLN
INTRAVENOUS | Status: AC
  Filled 2017-10-07: qty 5

## 2017-10-07 MED ORDER — SODIUM CHLORIDE 0.9% FLUSH
3.0000 mL | INTRAVENOUS | Status: DC | PRN
  Administered 2017-10-07: 3 mL
  Filled 2017-10-07: qty 10

## 2017-10-07 MED ORDER — PROCHLORPERAZINE MALEATE 10 MG PO TABS
ORAL_TABLET | ORAL | Status: AC
  Filled 2017-10-07: qty 1

## 2017-10-07 MED ORDER — PACLITAXEL PROTEIN-BOUND CHEMO INJECTION 100 MG
100.0000 mg/m2 | Freq: Once | INTRAVENOUS | Status: AC
  Administered 2017-10-07: 200 mg via INTRAVENOUS
  Filled 2017-10-07: qty 40

## 2017-10-07 NOTE — Progress Notes (Signed)
Danny Booker tolerated Abraxane infusion well without complaints or incident. Labs reviewed with Mike Craze NP prior to administering this medication. PICC line ( both lumens ) flushed per protocol with dressing and caps changed. VSS upon discharge. Pt discharged via wheelchair in satisfactory condition accompanied by his wife

## 2017-10-07 NOTE — Patient Instructions (Signed)
[facility] Discharge Instructions for Patients Receiving Chemotherapy   Beginning January 23rd 2017 lab work for the [facility] will be done in the  Main lab at [facility] on 1st floor. If you have a lab appointment with the [facility] please come in thru the  Main Entrance and check in at the main information desk   Today you received the following chemotherapy agents Abraxane. Follow-up as scheduled. Call clinic for any questions or concerns  To help prevent nausea and vomiting after your treatment, we encourage you to take your nausea medication   If you develop nausea and vomiting, or diarrhea that is not controlled by your medication, call the clinic.  The clinic phone number is [phone]. Office hours are Monday-Friday 8:30am-5:00pm.  BELOW ARE SYMPTOMS THAT SHOULD BE REPORTED IMMEDIATELY:  *FEVER GREATER THAN 101.0 F  *CHILLS WITH OR WITHOUT FEVER  NAUSEA AND VOMITING THAT IS NOT CONTROLLED WITH YOUR NAUSEA MEDICATION  *UNUSUAL SHORTNESS OF BREATH  *UNUSUAL BRUISING OR BLEEDING  TENDERNESS IN MOUTH AND THROAT WITH OR WITHOUT PRESENCE OF ULCERS  *URINARY PROBLEMS  *BOWEL PROBLEMS  UNUSUAL RASH Items with * indicate a potential emergency and should be followed up as soon as possible. If you have an emergency after office hours please contact your primary care physician or go to the nearest emergency department.  Please call the clinic during office hours if you have any questions or concerns.   You may also contact the Patient Navigator at [phone] should you have any questions or need assistance in obtaining follow up care.      Resources For Cancer Patients and their Caregivers ? American Cancer Society: Can assist with transportation, wigs, general needs, runs Look Good Feel Better.        229-710-0313 ? Cancer Care: Provides financial assistance, online support groups, medication/co-pay assistance.  1-800-813-HOPE  775-413-0605) ? [place] Assists [place] cancer patients and their families through emotional , educational and financial support.  (667)085-7853 ? [place] DSS Where to apply for food stamps, Medicaid and utility assistance. 704-564-7074 ? RCATS: Transportation to medical appointments. 325-115-2136 ? Social Security Administration: May apply for disability if have a Stage IV cancer. (614)704-8073 [phone] ? [employer], Disability and Transit Services: Assists with nutrition, care and transit needs. [phone]

## 2017-10-08 ENCOUNTER — Encounter (HOSPITAL_COMMUNITY): Payer: Self-pay | Admitting: Adult Health

## 2017-10-08 ENCOUNTER — Other Ambulatory Visit (HOSPITAL_COMMUNITY): Payer: Self-pay | Admitting: Pharmacist

## 2017-10-08 ENCOUNTER — Telehealth (HOSPITAL_COMMUNITY): Payer: Self-pay | Admitting: Emergency Medicine

## 2017-10-08 NOTE — Telephone Encounter (Signed)
Spoke with [facility] today to check on PDL-One status.  They state that they have requested additional tissue on 3/13 for the IHC PDL one and have not received this tissue as of yet to run this test.

## 2017-10-14 ENCOUNTER — Inpatient Hospital Stay (HOSPITAL_BASED_OUTPATIENT_CLINIC_OR_DEPARTMENT_OTHER): Payer: Medicare Other | Admitting: Hematology

## 2017-10-14 ENCOUNTER — Other Ambulatory Visit: Payer: Self-pay

## 2017-10-14 ENCOUNTER — Inpatient Hospital Stay (HOSPITAL_COMMUNITY): Payer: Medicare Other

## 2017-10-14 ENCOUNTER — Encounter (HOSPITAL_COMMUNITY): Payer: Self-pay | Admitting: Hematology

## 2017-10-14 VITALS — BP 107/61 | HR 104 | Temp 96.7°F | Resp 24 | Wt 154.4 lb

## 2017-10-14 DIAGNOSIS — R531 Weakness: Secondary | ICD-10-CM

## 2017-10-14 DIAGNOSIS — I10 Essential (primary) hypertension: Secondary | ICD-10-CM | POA: Diagnosis not present

## 2017-10-14 DIAGNOSIS — Z5111 Encounter for antineoplastic chemotherapy: Secondary | ICD-10-CM | POA: Diagnosis not present

## 2017-10-14 DIAGNOSIS — C3490 Malignant neoplasm of unspecified part of unspecified bronchus or lung: Secondary | ICD-10-CM | POA: Diagnosis not present

## 2017-10-14 LAB — VITAMIN B12: Vitamin B-12: 425 pg/mL (ref 180–914)

## 2017-10-14 LAB — IRON AND TIBC
IRON: 20 ug/dL — AB (ref 45–182)
Saturation Ratios: 11 % — ABNORMAL LOW (ref 17.9–39.5)
TIBC: 181 ug/dL — AB (ref 250–450)
UIBC: 161 ug/dL

## 2017-10-14 LAB — COMPREHENSIVE METABOLIC PANEL
ALK PHOS: 110 U/L (ref 38–126)
ALT: 23 U/L (ref 17–63)
ANION GAP: 13 (ref 5–15)
AST: 19 U/L (ref 15–41)
Albumin: 2.6 g/dL — ABNORMAL LOW (ref 3.5–5.0)
BILIRUBIN TOTAL: 0.7 mg/dL (ref 0.3–1.2)
BUN: 19 mg/dL (ref 6–20)
CALCIUM: 9.3 mg/dL (ref 8.9–10.3)
CO2: 29 mmol/L (ref 22–32)
CREATININE: 0.89 mg/dL (ref 0.61–1.24)
Chloride: 86 mmol/L — ABNORMAL LOW (ref 101–111)
Glucose, Bld: 112 mg/dL — ABNORMAL HIGH (ref 65–99)
Potassium: 3.6 mmol/L (ref 3.5–5.1)
SODIUM: 128 mmol/L — AB (ref 135–145)
Total Protein: 6.5 g/dL (ref 6.5–8.1)

## 2017-10-14 LAB — CBC WITH DIFFERENTIAL/PLATELET
BASOS PCT: 1 %
Basophils Absolute: 0 10*3/uL (ref 0.0–0.1)
EOS ABS: 0 10*3/uL (ref 0.0–0.7)
Eosinophils Relative: 1 %
HCT: 24 % — ABNORMAL LOW (ref 39.0–52.0)
HEMOGLOBIN: 8.1 g/dL — AB (ref 13.0–17.0)
LYMPHS PCT: 24 %
Lymphs Abs: 1 10*3/uL (ref 0.7–4.0)
MCH: 30 pg (ref 26.0–34.0)
MCHC: 33.8 g/dL (ref 30.0–36.0)
MCV: 88.9 fL (ref 78.0–100.0)
MONO ABS: 0.6 10*3/uL (ref 0.1–1.0)
Monocytes Relative: 13 %
NEUTROS PCT: 61 %
Neutro Abs: 2.7 10*3/uL (ref 1.7–7.7)
PLATELETS: 263 10*3/uL (ref 150–400)
RBC: 2.7 MIL/uL — ABNORMAL LOW (ref 4.22–5.81)
RDW: 13.3 % (ref 11.5–15.5)
WBC: 4.3 10*3/uL (ref 4.0–10.5)

## 2017-10-14 LAB — FERRITIN: FERRITIN: 1067 ng/mL — AB (ref 24–336)

## 2017-10-14 LAB — FOLATE: Folate: 28 ng/mL (ref >5.9)

## 2017-10-14 LAB — LACTATE DEHYDROGENASE: LDH: 121 U/L (ref 98–192)

## 2017-10-14 LAB — MAGNESIUM: Magnesium: 1.2 mg/dL — ABNORMAL LOW (ref 1.7–2.4)

## 2017-10-14 MED ORDER — SODIUM CHLORIDE 0.9 % IV SOLN: 2.0000 g | Freq: Once | INTRAVENOUS | Status: DC

## 2017-10-14 MED ORDER — SODIUM CHLORIDE 0.9 % IV SOLN
Freq: Once | INTRAVENOUS | Status: AC
  Administered 2017-10-14: 11:00:00 via INTRAVENOUS

## 2017-10-14 MED ORDER — SODIUM CHLORIDE 0.9% FLUSH: 3.0000 mL | Freq: Once | INTRAVENOUS | Status: DC

## 2017-10-14 MED ORDER — SODIUM CHLORIDE 0.9 % IV SOLN
2.0000 g | Freq: Once | INTRAVENOUS | Status: AC
  Administered 2017-10-14: 2 g via INTRAVENOUS
  Filled 2017-10-14: qty 4

## 2017-10-14 MED ORDER — SODIUM CHLORIDE 0.9 % IV SOLN: INTRAVENOUS | Status: DC

## 2017-10-14 MED ORDER — HEPARIN SOD (PORK) LOCK FLUSH 100 UNIT/ML IV SOLN: 250.0000 [IU] | Freq: Once | INTRAVENOUS | Status: DC

## 2017-10-14 NOTE — Progress Notes (Signed)
Danny Booker tolerated Magnesium infusion and hydration well without complaints or incident. Labs reviewed with and pt seen by Dr. Delton Coombes today and chemo tx held per MD. Orders obtained for Magnesium infusion and hydration only. PICC line flushed per protocol with dressing and caps changed as well. VSS upon discharge. Pt discharged via wheelchair in stable condition accompanied by family members

## 2017-10-14 NOTE — Patient Instructions (Signed)
[place] at [facility] Discharge Instructions  You were seen today by Dr. Delton Coombes    Thank you for choosing [place] at [facility] to provide your oncology and hematology care.  To afford each patient quality time with our provider, please arrive at least 15 minutes before your scheduled appointment time.   If you have a lab appointment with the [facility] [facility] please come in thru the  Main Entrance and check in at the main information desk  You need to re-schedule your appointment should you arrive 10 or more minutes late.  We strive to give you quality time with our providers, and arriving late affects you and other patients whose appointments are after yours.  Also, if you no show three or more times for appointments you may be dismissed from the clinic at the providers discretion.     Again, thank you for choosing [facility].  Our hope is that these requests will decrease the amount of time that you wait before being seen by our physicians.       _____________________________________________________________  Should you have questions after your visit to [facility], please contact our office at [phone] between the hours of 8:30 a.m. and 4:30 p.m.  Voicemails left after 4:30 p.m. will not be returned until the following business day.  For prescription refill requests, have your pharmacy contact our office.       Resources For Cancer Patients and their Caregivers ? American Cancer Society: Can assist with transportation, wigs, general needs, runs Look Good Feel Better.        754 571 2236 ? Cancer Care: Provides financial assistance, online support groups, medication/co-pay assistance.  1-800-813-HOPE (617)629-7208) ? [place] Assists [place] cancer patients and their families through emotional , educational and financial support.  661-292-9458 ? [place] DSS Where to  apply for food stamps, Medicaid and utility assistance. 3205367315 ? RCATS: Transportation to medical appointments. 8104672922 ? Social Security Administration: May apply for disability if have a Stage IV cancer. 431-641-4806 320-600-5740 ? [employer], Disability and Transit Services: Assists with nutrition, care and transit needs. [place] Support Programs:   > Cancer Support Group  2nd Tuesday of the month 1pm-2pm, Journey Room   > Creative Journey  3rd Tuesday of the month 1130am-1pm, Journey Room

## 2017-10-14 NOTE — Patient Instructions (Signed)
[place] at [facility] Discharge Instructions  Chemo tx held today and pt received Magnesium infusion and hydration. Follow-up as scheduled. Call clinic for any questions or concerns   Thank you for choosing [place] at [facility] to provide your oncology and hematology care.  To afford each patient quality time with our provider, please arrive at least 15 minutes before your scheduled appointment time.   If you have a lab appointment with the [facility] please come in thru the  Main Entrance and check in at the main information desk  You need to re-schedule your appointment should you arrive 10 or more minutes late.  We strive to give you quality time with our providers, and arriving late affects you and other patients whose appointments are after yours.  Also, if you no show three or more times for appointments you may be dismissed from the clinic at the providers discretion.     Again, thank you for choosing [facility].  Our hope is that these requests will decrease the amount of time that you wait before being seen by our physicians.       _____________________________________________________________  Should you have questions after your visit to [facility], please contact our office at [phone] between the hours of 8:30 a.m. and 4:30 p.m.  Voicemails left after 4:30 p.m. will not be returned until the following business day.  For prescription refill requests, have your pharmacy contact our office.       Resources For Cancer Patients and their Caregivers ? American Cancer Society: Can assist with transportation, wigs, general needs, runs Look Good Feel Better.        [phone] ? Cancer Care: Provides financial assistance, online support groups, medication/co-pay assistance.  1-800-813-HOPE (726)515-4673) ? [place] Assists [place] cancer patients and their families  through emotional , educational and financial support.  (715) 738-4863 ? [place] DSS Where to apply for food stamps, Medicaid and utility assistance. 7863745094 ? RCATS: Transportation to medical appointments. (580) 752-8574 ? Social Security Administration: May apply for disability if have a Stage IV cancer. 380-669-3740 (867)167-3016 ? [employer], Disability and Transit Services: Assists with nutrition, care and transit needs. [place] Support Programs:   > Cancer Support Group  2nd Tuesday of the month 1pm-2pm, Journey Room   > Creative Journey  3rd Tuesday of the month 1130am-1pm, Journey Room

## 2017-10-14 NOTE — Progress Notes (Signed)
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  CURRENT THERAPY: First cycle of carboplatin and Abraxane on 09/23/2017  BRIEF ONCOLOGIC HISTORY:    Squamous carcinoma of lung, unspecified laterality (Danny Booker)   09/17/2017 Initial Diagnosis    Squamous carcinoma of lung, unspecified laterality (Danny Booker)      10/14/2017 Cancer Staging    Staging form: Lung, AJCC 8th Edition - Clinical stage from 10/14/2017: Stage IV (pM1a) - Signed by Derek Jack, MD on 10/14/2017        CANCER STAGING: Cancer Staging Squamous carcinoma of lung, unspecified laterality (Danny Booker) Staging form: Lung, AJCC 8th Edition - Clinical stage from 10/14/2017: Stage IV (pM1a) - Signed by Derek Jack, MD on 10/14/2017    INTERVAL HISTORY:  Danny Booker 77 y.o. male returns for next cycle of chemotherapy.  However for the past 1 week he has a lot of pain in his mouth.  He is not able to eat food as it makes him nauseous.  He also has pain.  As a result he is severely weak.  He is continuing to smoke cigarettes.  He denies any tingling or numbness in the extremities.  He lost about 10 pounds in the last 3 weeks.  He continues to be hoarse.    REVIEW OF SYSTEMS:  Review of Systems  Constitutional: Positive for fatigue.  HENT:   Positive for mouth sores. Negative for sore throat.   Respiratory: Positive for shortness of breath.   Cardiovascular: Negative.   Gastrointestinal: Positive for diarrhea and nausea.  Genitourinary: Negative.    Musculoskeletal: Negative.   Skin: Negative.   Neurological: Negative.   Psychiatric/Behavioral: Negative.      PAST MEDICAL/SURGICAL HISTORY:  Past Medical History:  Diagnosis Date  . Anxiety   . Bipolar disorder ([place])   . Cancer ([place])      skin cancer  . COPD (chronic obstructive pulmonary disease) ([place])   . GERD (gastroesophageal reflux disease)   . HOH (hard of hearing)   . Hypertension    Past Surgical History:  Procedure Laterality Date  . APPENDECTOMY    . CATARACT EXTRACTION, BILATERAL Bilateral   . FLEXIBLE BRONCHOSCOPY N/A 09/04/2017   Procedure: FLEXIBLE BRONCHOSCOPY WITH PROPOFOL;  Surgeon: Sinda Du, MD;  Location: [facility];  Service: Cardiopulmonary;  Laterality: N/A;  . SKIN CANCER EXCISION     hand     SOCIAL HISTORY:  Social History   Socioeconomic History  . Marital status: Married    Spouse name: paula  . Number of children: 6  . Years of education: Not on file  . Highest education level: Some college, no degree  Occupational History  . Occupation: retired  Scientific laboratory technician  . Financial resource strain: Not hard at all  . Food insecurity:    Worry: Never true    Inability: Never true  . Transportation needs:    Medical: No    Non-medical: No  Tobacco Use  . Smoking status: Current Every Day Smoker    Packs/day: 1.00    Years: 60.00    Pack years: 60.00    Types: Cigarettes  . Smokeless tobacco: Never Used  Substance and Sexual Activity  . Alcohol use: Yes    Comment: 46 oz beer q night  . Drug use:  No  . Sexual activity: Yes    Birth control/protection: None  Lifestyle  . Physical activity:    Days per week: 0 days    Minutes per session: 0 min  . Stress: Not at all  Relationships  . Social connections:    Talks on phone: More than three times a week    Gets together: More than three times a week    Attends religious service: 1 to 4 times per year    Active member of club or organization: No    Attends meetings of clubs or organizations: Never    Relationship status: Married  . Intimate partner violence:    Fear of current or ex partner: No    Emotionally abused: No    Physically abused: No    Forced sexual activity: No  Other Topics Concern  . 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    FAMILY HISTORY:  Family History  Problem Relation Age of Onset  . Heart attack Mother   . Stroke Father   . Leukemia Brother   . Other Brother     CURRENT MEDICATIONS:  Outpatient Encounter Medications as of 10/14/2017  Medication Sig  . albuterol (PROVENTIL HFA;VENTOLIN HFA) 108 (90 Base) MCG/ACT inhaler Inhale 1 puff into the lungs every 6 (six) hours as needed for wheezing or shortness of breath.  Marland Kitchen amLODipine (NORVASC) 5 MG tablet Take 5 mg by mouth daily.  Marland Kitchen atorvastatin (LIPITOR) 10 MG tablet Take 10 mg by mouth daily.   . baclofen (LIORESAL) 10 MG tablet Take 10 mg by mouth daily as needed for muscle spasms.  . Capsaicin (CAPZASIN-HP EX) Apply 1 application topically daily as needed (pain).  . CARBOPLATIN IV Inject into the vein.  Marland Kitchen co-enzyme Q-10 30 MG capsule Take 30 mg by mouth 2 (two) times daily.  . DULoxetine (CYMBALTA) 60 MG capsule Take 60 mg by mouth daily.  . Fluticasone-Umeclidin-Vilant (TRELEGY ELLIPTA) 100-62.5-25 MCG/INH AEPB Inhale 1 puff into the lungs at bedtime.  . Guaifenesin 1200 MG TB12 Take 1,200 mg by mouth daily.  Javier Docker Oil 500 MG CAPS Take 500 mg by mouth at bedtime.  Marland Kitchen LORazepam (ATIVAN) 1 MG tablet Take 1 pill by mouth the day of procedure about 30 minutes before you arrive.  . losartan-hydrochlorothiazide (HYZAAR) 100-12.5 MG tablet Take 1 tablet by mouth daily.  . Misc Natural Products (OSTEO BI-FLEX ADV TRIPLE ST PO) Take 1 tablet by mouth 2 (two) times daily.  . Multiple Vitamin (MULTIVITAMIN WITH MINERALS) TABS tablet Take 1 tablet by mouth daily. One-A-Day Men's 44+  . naproxen sodium (ALEVE) 220 MG tablet Take 220 mg by mouth daily as needed (pain).  . ondansetron (ZOFRAN) 8 MG tablet Take 1 tablet (8 mg total) by mouth 2 (two) times daily as needed for refractory nausea / vomiting. Start on day 3 after carboplatin  chemo.  Marland Kitchen PACLitaxel Protein-Bound Part (ABRAXANE IV) Inject into the vein.  Marland Kitchen prochlorperazine (COMPAZINE) 10 MG tablet Take 1 tablet (10 mg total) by mouth every 6 (six) hours as needed (Nausea or vomiting).  . ranitidine (ZANTAC) 150 MG tablet Take 150 mg by mouth 2 (two) times daily as needed for heartburn.   . sodium chloride flush 0.9 % SOLN injection Inject 10 mLs into the vein 2 (two) times a week. Flush 10 mLs  into each catheter of PICC line twice weekly.  (  double lumen)  . Pembrolizumab (KEYTRUDA IV) Inject into the vein.   No facility-administered encounter medications on file as of 10/14/2017.     ALLERGIES:  No Known Allergies   PHYSICAL EXAM:  ECOG Performance status: 2  Vitals:   10/14/17 0928  BP: 107/61  Pulse: (!) 104  Resp: (!) 24  Temp: (!) 96.7 F (35.9 C)  SpO2: 100%   Filed Weights   10/14/17 0928  Weight: 154 lb 6.4 oz (70 kg)    Physical Exam  HENT:  Oropharyngeal exam shows mucositis.  Pulmonary/Chest: Effort normal and breath sounds normal.  Psychiatric: Mood, memory, affect and judgment normal.     LABORATORY DATA:  I have reviewed the labs as listed.  CBC    Component Value Date/Time   WBC 4.3 10/14/2017 0931   RBC 2.70 (L) 10/14/2017 0931   HGB 8.1 (L) 10/14/2017 0931   HCT 24.0 (L) 10/14/2017 0931   PLT 263 10/14/2017 0931   MCV 88.9 10/14/2017 0931   MCH 30.0 10/14/2017 0931   MCHC 33.8 10/14/2017 0931   RDW 13.3 10/14/2017 0931   LYMPHSABS 1.0 10/14/2017 0931   MONOABS 0.6 10/14/2017 0931   EOSABS 0.0 10/14/2017 0931   BASOSABS 0.0 10/14/2017 0931   CMP Latest Ref Rng & Units 10/14/2017 10/07/2017 09/30/2017  Glucose 65 - 99 mg/dL 112(H) 119(H) 130(H)  BUN 6 - 20 mg/dL 19 19 29(H)  Creatinine 0.61 - 1.24 mg/dL 0.89 0.64 1.02  Sodium 135 - 145 mmol/L 128(L) 129(L) 127(L)  Potassium 3.5 - 5.1 mmol/L 3.6 3.8 3.5  Chloride 101 - 111 mmol/L 86(L) 88(L) 89(L)  CO2 22 - 32 mmol/L _0 Calcium 8.9 - 10.3 mg/dL 9.3 9.2 9.2    Total Protein 6.5 - 8.1 g/dL 6.5 6.7 6.7  Total Bilirubin 0.3 - 1.2 mg/dL 0.7 0.5 0.9  Alkaline Phos 38 - 126 U/L 110 118 97  AST 15 - 41 U/L _1 ALT 17 - 63 U/L 23 35 16(L)       DIAGNOSTIC IMAGING:  I have reviewed his PET/CT scan on 09/16/2017 which showed cavitary neoplasm in the left lower lobe.  Widespread bilateral pleural metastasis.  Widespread mediastinal adenopathy.  No evidence of metastatic disease outside the chest.     ASSESSMENT & PLAN:   Squamous carcinoma of lung, unspecified laterality (Danny Booker) 1.  Metastatic squamous cell carcinoma of the lung: His metastatic disease is limited to the bilateral pleura and widespread mediastinal adenopathy.  [facility] CDX testing showed MS-Stable, TMB-intermediate,SOX-2 purification.  He received his first cycle of carboplatin and Abraxane on 09/23/2017.  He has been receiving weekly Abraxane on day 8 and day 15.  Today he is due for cycle 2.  He was supposed to start immunotherapy with pembrolizumab along with chemotherapy.  However he has mucositis and lost about 10 pounds in the last 3 weeks.  He is also fairly weak.  If he is not eating much.  I have given a prescription for Magic mouthwash.  I have asked him to drink boost or Ensure Plus about 3-6 cans a day.  We will hold off on his chemotherapy today and reassess him in 1 week.  I will strongly consider dose reductions during cycle 2.  2.  Nausea: He is taking Compazine every 6 hours which is helping slightly.  I have asked him to take Zofran every 8 hours as needed on top of it.  3.  Hypomagnesemia: His magnesium level  is 1.2 today.  He will receive intravenous magnesium.  If it continues to be low, we will consider oral supplements.  He has also received 500 mL of normal saline.      Orders placed this encounter:  No orders of the defined types were placed in this encounter.     Derek Jack, MD [place] [phone]

## 2017-10-14 NOTE — Assessment & Plan Note (Signed)
1.  Metastatic squamous cell carcinoma of the lung: His metastatic disease is limited to the bilateral pleura and widespread mediastinal adenopathy.  [facility] CDX testing showed MS-Stable, TMB-intermediate,SOX-2 purification.  He received his first cycle of carboplatin and Abraxane on 09/23/2017.  He has been receiving weekly Abraxane on day 8 and day 15.  Today he is due for cycle 2.  He was supposed to start immunotherapy with pembrolizumab along with chemotherapy.  However he has mucositis and lost about 10 pounds in the last 3 weeks.  He is also fairly weak.  If he is not eating much.  I have given a prescription for Magic mouthwash.  I have asked him to drink boost or Ensure Plus about 3-6 cans a day.  We will hold off on his chemotherapy today and reassess him in 1 week.  I will strongly consider dose reductions during cycle 2.  2.  Nausea: He is taking Compazine every 6 hours which is helping slightly.  I have asked him to take Zofran every 8 hours as needed on top of it.  3.  Hypomagnesemia: His magnesium level is 1.2 today.  He will receive intravenous magnesium.  If it continues to be low, we will consider oral supplements.  He has also received 500 mL of normal saline.

## 2017-10-15 ENCOUNTER — Telehealth (HOSPITAL_COMMUNITY): Payer: Self-pay

## 2017-10-15 NOTE — Telephone Encounter (Signed)
Wife called checking on Magic Mouth wash prescription for her husband.  Chart checked and reviewed with Dr. Delton Coombes and ok to call in to patients pharmacy.

## 2017-10-21 ENCOUNTER — Inpatient Hospital Stay (HOSPITAL_COMMUNITY): Payer: Medicare Other | Attending: Internal Medicine

## 2017-10-21 ENCOUNTER — Encounter (HOSPITAL_COMMUNITY): Payer: Self-pay | Admitting: Dietician

## 2017-10-21 ENCOUNTER — Encounter (HOSPITAL_COMMUNITY): Payer: Self-pay

## 2017-10-21 ENCOUNTER — Other Ambulatory Visit: Payer: Self-pay

## 2017-10-21 VITALS — BP 109/61 | HR 96 | Temp 97.7°F | Resp 22 | Wt 150.2 lb

## 2017-10-21 DIAGNOSIS — R630 Anorexia: Secondary | ICD-10-CM | POA: Diagnosis not present

## 2017-10-21 DIAGNOSIS — Z5111 Encounter for antineoplastic chemotherapy: Secondary | ICD-10-CM | POA: Insufficient documentation

## 2017-10-21 DIAGNOSIS — Z452 Encounter for adjustment and management of vascular access device: Secondary | ICD-10-CM | POA: Diagnosis not present

## 2017-10-21 DIAGNOSIS — Z5112 Encounter for antineoplastic immunotherapy: Secondary | ICD-10-CM | POA: Diagnosis not present

## 2017-10-21 DIAGNOSIS — R110 Nausea: Secondary | ICD-10-CM | POA: Insufficient documentation

## 2017-10-21 DIAGNOSIS — C3490 Malignant neoplasm of unspecified part of unspecified bronchus or lung: Secondary | ICD-10-CM | POA: Diagnosis present

## 2017-10-21 LAB — COMPREHENSIVE METABOLIC PANEL
ALBUMIN: 2.3 g/dL — AB (ref 3.5–5.0)
ALT: 16 U/L — ABNORMAL LOW (ref 17–63)
AST: 21 U/L (ref 15–41)
Alkaline Phosphatase: 116 U/L (ref 38–126)
Anion gap: 11 (ref 5–15)
BILIRUBIN TOTAL: 0.5 mg/dL (ref 0.3–1.2)
BUN: 15 mg/dL (ref 6–20)
CO2: 32 mmol/L (ref 22–32)
Calcium: 8.9 mg/dL (ref 8.9–10.3)
Chloride: 87 mmol/L — ABNORMAL LOW (ref 101–111)
Creatinine, Ser: 1.05 mg/dL (ref 0.61–1.24)
GFR calc Af Amer: >60 mL/min (ref >60)
GFR calc non Af Amer: >60 mL/min (ref >60)
GLUCOSE: 117 mg/dL — AB (ref 65–99)
POTASSIUM: 3.2 mmol/L — AB (ref 3.5–5.1)
SODIUM: 130 mmol/L — AB (ref 135–145)
TOTAL PROTEIN: 6.3 g/dL — AB (ref 6.5–8.1)

## 2017-10-21 LAB — CBC WITH DIFFERENTIAL/PLATELET
BASOS ABS: 0.1 10*3/uL (ref 0.0–0.1)
Basophils Relative: 1 %
EOS PCT: 1 %
Eosinophils Absolute: 0.1 10*3/uL (ref 0.0–0.7)
HEMATOCRIT: 25.2 % — AB (ref 39.0–52.0)
HEMOGLOBIN: 8.2 g/dL — AB (ref 13.0–17.0)
LYMPHS ABS: 2.3 10*3/uL (ref 0.7–4.0)
Lymphocytes Relative: 15 %
MCH: 28.9 pg (ref 26.0–34.0)
MCHC: 32.5 g/dL (ref 30.0–36.0)
MCV: 88.7 fL (ref 78.0–100.0)
MONO ABS: 1.7 10*3/uL (ref 0.1–1.0)
MONOS PCT: 11 %
NEUTROS ABS: 11.2 10*3/uL (ref 1.7–7.7)
Neutrophils Relative %: 72 %
Platelets: 442 10*3/uL — ABNORMAL HIGH (ref 150–400)
RBC: 2.84 MIL/uL — ABNORMAL LOW (ref 4.22–5.81)
RDW: 13.9 % (ref 11.5–15.5)
WBC: 15.3 10*3/uL — ABNORMAL HIGH (ref 4.0–10.5)

## 2017-10-21 LAB — PREPARE RBC (CROSSMATCH)

## 2017-10-21 LAB — MAGNESIUM: Magnesium: 1 mg/dL — ABNORMAL LOW (ref 1.7–2.4)

## 2017-10-21 MED ORDER — SODIUM CHLORIDE 0.9 % IV SOLN
200.0000 mg | Freq: Once | INTRAVENOUS | Status: AC
  Administered 2017-10-21: 200 mg via INTRAVENOUS
  Filled 2017-10-21: qty 8

## 2017-10-21 MED ORDER — SODIUM CHLORIDE 0.9 % IV SOLN
Freq: Once | INTRAVENOUS | Status: AC
  Administered 2017-10-21: 13:00:00 via INTRAVENOUS

## 2017-10-21 MED ORDER — SODIUM CHLORIDE 0.9 % IV SOLN
Freq: Once | INTRAVENOUS | Status: AC
  Administered 2017-10-21: 13:00:00 via INTRAVENOUS
  Filled 2017-10-21: qty 1000

## 2017-10-21 MED ORDER — SODIUM CHLORIDE 0.9 % IV SOLN: 10.0000 mg | Freq: Once | INTRAVENOUS | Status: DC

## 2017-10-21 MED ORDER — DEXAMETHASONE SODIUM PHOSPHATE 10 MG/ML IJ SOLN
INTRAMUSCULAR | Status: AC
  Filled 2017-10-21: qty 1

## 2017-10-21 MED ORDER — HEPARIN SOD (PORK) LOCK FLUSH 100 UNIT/ML IV SOLN: 250.0000 [IU] | Freq: Once | INTRAVENOUS | Status: DC | PRN

## 2017-10-21 MED ORDER — DRONABINOL 5 MG PO CAPS: 5.0000 mg | ORAL_CAPSULE | Freq: Two times a day (BID) | ORAL | 2 refills | Status: AC

## 2017-10-21 MED ORDER — SODIUM CHLORIDE 0.9 % IV SOLN
349.2000 mg | Freq: Once | INTRAVENOUS | Status: AC
  Administered 2017-10-21: 350 mg via INTRAVENOUS
  Filled 2017-10-21: qty 35

## 2017-10-21 MED ORDER — EMPTY CONTAINERS FLEXIBLE MISC
75.0000 mg/m2 | Freq: Once | Status: AC
  Administered 2017-10-21: 150 mg via INTRAVENOUS
  Filled 2017-10-21: qty 30

## 2017-10-21 MED ORDER — PALONOSETRON HCL INJECTION 0.25 MG/5ML
0.2500 mg | Freq: Once | INTRAVENOUS | Status: AC
  Administered 2017-10-21: 0.25 mg via INTRAVENOUS

## 2017-10-21 MED ORDER — PALONOSETRON HCL INJECTION 0.25 MG/5ML
INTRAVENOUS | Status: AC
  Filled 2017-10-21: qty 5

## 2017-10-21 MED ORDER — SODIUM CHLORIDE 0.9 % IV SOLN
INTRAVENOUS | Status: DC
  Filled 2017-10-21 (×14): qty 1000

## 2017-10-21 MED ORDER — SODIUM CHLORIDE 0.9% FLUSH: 10.0000 mL | INTRAVENOUS | Status: DC | PRN

## 2017-10-21 MED ORDER — DEXAMETHASONE SODIUM PHOSPHATE 10 MG/ML IJ SOLN
10.0000 mg | Freq: Once | INTRAMUSCULAR | Status: AC
  Administered 2017-10-21: 10 mg via INTRAVENOUS

## 2017-10-21 NOTE — Progress Notes (Signed)
Labs reviewed with Dr. Delton Coombes - okay to tx today per MD.  Pt will received IV Mg++ and K+ as ordered per MD.  Pt will return tomorrow to receive 1 unit PRBC.   Tolerated infusions w/o adverse reaction.  Alert, in no distress.  VSS.  Discharged via wheelchair in c/o family.

## 2017-10-21 NOTE — Progress Notes (Addendum)
Nutrition Follow-up:  ASSESSMENT: 77 y/o male PMHx Anxiety, COPD, GERD, Bipolar Disorder, HTN, HOH. 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 Is s/p first cycle. Suffered significant side effects. Eating poorly and lost 15 lbs in just over 1 month.  RD had anticipated follow up via telephone, however, due to rapid decline, RD felt in patients best interest to see in person during infusion today.   On RD arrival, patient asleep receiving IVF. Wife is at bedside and shares majority of history.   Wife states mouth sores as well as severe nausea, vomiting and diarrhea began shortly after his first chemotherapy tx on 3/6. She says the symptoms persisted up until 1 week ago, at which point they slowly tapered off.    During this time period, PO intake of solids was understandably minimal. The patient would eat only "bites" of foods. She shares 2 examples where she prepared him sandwiches and he only took 3 bites. She notes he has been eating a lot of jello and icecream. He also is still drinking milk and has been consuming ATLEAST 8 oz of whole milk/day. Fluid intake was better. Patient would consistently drink regular sodas (ginerale) and 2 Boost/Ensure per day. However, she notes 1 week ago, pt experienced GI upset after having a Boost and he refused to drink them x1 week. Fortunately, she was able to coax him to drink one yesterday, he tolerated it well and is now back to consuming 2 regularly.   On RD initial assessment ~1 month ago, it was noted patient slept nearly 12 hrs per day. Today, wife reports patient still sleeps about the same amount, but he gets up intermittently to eat and then quickly goes back to sleep, largely in order to avoid postprandial nausea that he is afraid will come. Wife says his pattern is "sleep, eat, sleep, eat, sleep".   Obviously his nausea is a major factor. She admits they were taking the compazine/zofran more on a PRN basis rather than on a  scheduled basis. Pt later notes the "yellow" nausea medication gave him diarrhea  Regarding his diarrhea, spouse gave patient a "special type of imodium that had other things in it" for stomach pain and nausea. She says this worked well.   Regarding his mouth sores, spouse notes trouble obtaining at their local pharmacy and unfortunately they were unable to procure the Magic Mouthwash up until 3 days ago. Today, the patient notes his sores are resolved.   Later, when patient wakes up, he shares that his lack of appetite is a larger problem than the nausea. He had mentioned not having any appetite and altered taste on initial assessment. It sounds this may have even worsened. He says he often is able to "work up an appetite" but as soon as he takes a bite of a food, he loses all desire to eat. He gets emotional when saying that he feels sorry for his wife who goes through all this trouble to make food that he cannot eat.    Medications: Chemo: Carboplatin, Keytruda, Abraxxane Supportive Medications: Compazine, Zofran, H2RA, Ativan, Aleve, MVI w/ min. Magic mouthwash?  Labs: Mag: extremely low: 1.0, K: 3.2, albumin down to 2.3. Initial level 3.1 on 2/22. Na: 130. Surprisingly Creat/Bun wdl. WBC:15.3  Recent Labs  Lab 10/21/17 1150  NA 130*  K 3.2*  CL 87*  CO2 32  BUN 15  CREATININE 1.05  CALCIUM 8.9  MG 1.0*  GLUCOSE 117*     Wt  Readings from Last 10 Encounters:  10/21/17 150 lb 3.2 oz (68.1 kg)  10/14/17 154 lb 6.4 oz (70 kg)  10/07/17 156 lb 9.6 oz (71 kg)  09/30/17 159 lb 14.4 oz (72.5 kg)  09/23/17 166 lb 4.8 oz (75.4 kg)  09/17/17 165 lb (74.8 kg)  09/11/17 165 lb 9.6 oz (75.1 kg)  09/01/17 166 lb (75.3 kg)  02/11/17 175 lb (79.4 kg)   Anthropometrics:  Height: 5' 11"  (180.34 cm) Weight: 150 lbs 3oz (68.1 kg) UBW: 186 lbs BMI: 20.96  Estimated Energy Needs Kcals: 2050-2250 (30-33  kcal/kg bw) Protein: 89-102g Pro (1.3-1.5 g/kg bw) Fluid: 2.1-2.3 L fluid (1  ml/kcal)  NUTRITION DIAGNOSIS: Inadequate oral intake r/t cancer and cancer related treatments AEB  Wt loss of >5% x 1 month and an oral intake that has met </= 75% of needs for >/= to 1 month. Severe malnutrition criterion  MALNUTRITION DIAGNOSIS: Severe malnutrition. Chronic. Criteria: Wt loss of >5% x 1 month and an oral intake that has met </= 75% of needs for >/= to 1 month  INTERVENTION:  While patient asleep, RD reviewed handouts titled "Nausea and vomiting" and "Sore Mouth". We discussed alternative ways of treating these symptoms (ie baking soda/salt mouth rinse, seabands, ginger extract etc), dietary behaviors to improve symptoms (eating foods cold, avoiding cooking smells etc) and reviewed foods that are recommended/not recommended for each condition.   Regarding nausea, RD discussed of taking the nausea medication on a SCHEDULED basis, rather than PRN. He should be making in anticipation of nausea and it is essential he do this. She expressed understanding  Notably, during review of mouth soreness handout, spouse saw that alcohol and tobacco were advised to be avoided. While RD had only highlighted tobacco as he was not documented patient consumed ETOH, wife nonchalantly comments that "he has stopped drinking". RD asked if he stopped recently, and she shares that the evening he received his first chemo infusion, he had 4 beers. Shortly after, he experienced severe sense of coldness and "chills". They increased heater and wrapped patient in heated blankets. Since this occurrence, patient has not had alcohol. Prior to this, spouse shares he was drinking a 6-pack/night. MD made aware of this revelation.  Offered case of Ensure, but spouse says they currently have enough at home. RD did provide coupons.   RD shared patients report of complete nonexistence of appetite to MD. RD asked if appetite stimulant would be appropriate. MD prescribed marinol. RD returned to inform pt/spouse. RD explained  it was like synthetic marajuana, without the THC component. Patient signals for RD to come closer and says "I already got some of that". RD shared this is a legal version and should still try the medication regardless  Towards end of encounter, patients labwork returned. Reviewed CMP results with spouse. RN shares that based on her clinical experience, it is unlikely pt will receive chemo, though of course this up to MD. RD feels patient would benefit nutritionally from further deferment of tx d/t worsening malnutrition and abysmal nutritional status.  He remains at extremely high nutritional risk.  MONITORING, EVALUATION, GOAL: Weight, reported oral intake, Improved chemotherapy tolerance,   NEXT VISIT: 2-3 weeks as pts schedule allows  Burtis Junes RD, LDN, CNSC Clinical Nutrition Available Tues-Sat via Pager: [phone] 10/21/2017 11:58 AM

## 2017-10-22 ENCOUNTER — Inpatient Hospital Stay (HOSPITAL_COMMUNITY): Payer: Medicare Other

## 2017-10-22 DIAGNOSIS — C3490 Malignant neoplasm of unspecified part of unspecified bronchus or lung: Secondary | ICD-10-CM

## 2017-10-22 DIAGNOSIS — Z5112 Encounter for antineoplastic immunotherapy: Secondary | ICD-10-CM | POA: Diagnosis not present

## 2017-10-22 MED ORDER — ACETAMINOPHEN 325 MG PO TABS
650.0000 mg | ORAL_TABLET | Freq: Once | ORAL | Status: AC
  Administered 2017-10-22: 650 mg via ORAL

## 2017-10-22 MED ORDER — SODIUM CHLORIDE 0.9% FLUSH
10.0000 mL | INTRAVENOUS | Status: AC | PRN
  Administered 2017-10-22: 10 mL

## 2017-10-22 MED ORDER — DIPHENHYDRAMINE HCL 25 MG PO CAPS
25.0000 mg | ORAL_CAPSULE | Freq: Once | ORAL | Status: AC
  Administered 2017-10-22: 25 mg via ORAL

## 2017-10-22 MED ORDER — SODIUM CHLORIDE 0.9 % IV SOLN
250.0000 mL | Freq: Once | INTRAVENOUS | Status: AC
  Administered 2017-10-22: 250 mL via INTRAVENOUS

## 2017-10-22 MED ORDER — HEPARIN SOD (PORK) LOCK FLUSH 100 UNIT/ML IV SOLN
250.0000 [IU] | INTRAVENOUS | Status: AC | PRN
  Administered 2017-10-22: 250 [IU]

## 2017-10-22 MED ORDER — DIPHENHYDRAMINE HCL 25 MG PO CAPS
ORAL_CAPSULE | ORAL | Status: AC
  Filled 2017-10-22: qty 1

## 2017-10-22 MED ORDER — ACETAMINOPHEN 325 MG PO TABS
ORAL_TABLET | ORAL | Status: AC
  Filled 2017-10-22: qty 2

## 2017-10-22 NOTE — Progress Notes (Signed)
Tolerated transfusion w/o adverse effects.  Alert, in no distress.  VSS.  Discharged via wheelchair in c/o family.

## 2017-10-23 LAB — BPAM RBC
Blood Product Expiration Date: 201904122359
ISSUE DATE / TIME: 201904041046
Unit Type and Rh: 1700

## 2017-10-23 LAB — TYPE AND SCREEN
ABO/RH(D): B NEG
Antibody Screen: NEGATIVE
UNIT DIVISION: 0

## 2017-10-26 ENCOUNTER — Encounter (HOSPITAL_COMMUNITY): Payer: Self-pay | Admitting: Emergency Medicine

## 2017-10-26 NOTE — Progress Notes (Signed)
Called [facility] and they stated that they could not run the PDL-1 because there was insufficient tissue.

## 2017-10-28 ENCOUNTER — Encounter (HOSPITAL_COMMUNITY): Payer: Self-pay

## 2017-10-28 ENCOUNTER — Inpatient Hospital Stay (HOSPITAL_COMMUNITY): Payer: Medicare Other

## 2017-10-28 VITALS — BP 95/53 | HR 73 | Temp 97.8°F | Resp 20 | Wt 143.4 lb

## 2017-10-28 DIAGNOSIS — C3490 Malignant neoplasm of unspecified part of unspecified bronchus or lung: Secondary | ICD-10-CM

## 2017-10-28 DIAGNOSIS — Z5112 Encounter for antineoplastic immunotherapy: Secondary | ICD-10-CM | POA: Diagnosis not present

## 2017-10-28 DIAGNOSIS — R7989 Other specified abnormal findings of blood chemistry: Secondary | ICD-10-CM

## 2017-10-28 LAB — COMPREHENSIVE METABOLIC PANEL
ALBUMIN: 2.7 g/dL — AB (ref 3.5–5.0)
ALK PHOS: 125 U/L (ref 38–126)
ALT: 28 U/L (ref 17–63)
AST: 26 U/L (ref 15–41)
Anion gap: 18 — ABNORMAL HIGH (ref 5–15)
BUN: 52 mg/dL — ABNORMAL HIGH (ref 6–20)
CALCIUM: 9.1 mg/dL (ref 8.9–10.3)
CO2: 27 mmol/L (ref 22–32)
CREATININE: 2.57 mg/dL — AB (ref 0.61–1.24)
Chloride: 87 mmol/L — ABNORMAL LOW (ref 101–111)
GFR calc Af Amer: 26 mL/min — ABNORMAL LOW (ref >60)
GFR calc non Af Amer: 23 mL/min — ABNORMAL LOW (ref >60)
GLUCOSE: 110 mg/dL — AB (ref 65–99)
Potassium: 3.5 mmol/L (ref 3.5–5.1)
SODIUM: 132 mmol/L — AB (ref 135–145)
Total Bilirubin: 0.8 mg/dL (ref 0.3–1.2)
Total Protein: 6.9 g/dL (ref 6.5–8.1)

## 2017-10-28 LAB — CBC WITH DIFFERENTIAL/PLATELET
BASOS PCT: 0 %
Basophils Absolute: 0 10*3/uL (ref 0.0–0.1)
EOS ABS: 0.1 10*3/uL (ref 0.0–0.7)
Eosinophils Relative: 0 %
HCT: 29.1 % — ABNORMAL LOW (ref 39.0–52.0)
Hemoglobin: 9.6 g/dL — ABNORMAL LOW (ref 13.0–17.0)
Lymphocytes Relative: 5 %
Lymphs Abs: 0.9 10*3/uL (ref 0.7–4.0)
MCH: 29.1 pg (ref 26.0–34.0)
MCHC: 33 g/dL (ref 30.0–36.0)
MCV: 88.2 fL (ref 78.0–100.0)
MONO ABS: 1 10*3/uL (ref 0.1–1.0)
MONOS PCT: 5 %
Neutro Abs: 18.1 10*3/uL (ref 1.7–7.7)
Neutrophils Relative %: 90 %
Platelets: 379 10*3/uL (ref 150–400)
RBC: 3.3 MIL/uL — ABNORMAL LOW (ref 4.22–5.81)
RDW: 13.7 % (ref 11.5–15.5)
WBC: 20.1 10*3/uL — ABNORMAL HIGH (ref 4.0–10.5)

## 2017-10-28 LAB — LACTATE DEHYDROGENASE: LDH: 181 U/L (ref 98–192)

## 2017-10-28 MED ORDER — SODIUM CHLORIDE 0.9% FLUSH
10.0000 mL | Freq: Once | INTRAVENOUS | Status: AC
  Administered 2017-10-28: 10 mL via INTRAVENOUS

## 2017-10-28 MED ORDER — SODIUM CHLORIDE 0.9 % IV SOLN
INTRAVENOUS | Status: DC
  Administered 2017-10-28: 1000 mL via INTRAVENOUS

## 2017-10-28 MED ORDER — HEPARIN SOD (PORK) LOCK FLUSH 100 UNIT/ML IV SOLN
500.0000 [IU] | Freq: Once | INTRAVENOUS | Status: AC
  Administered 2017-10-28: 500 [IU] via INTRAVENOUS

## 2017-10-28 NOTE — Progress Notes (Signed)
Patient to treatment area for chemotherapy and PICC line dressing change.  Stated nausea better but did use a compazine earlier this morning.  Decreased appetite 25% but drinking with no problems.  Decreased energy and noted more fatigue.  No complaints of SOB and feels his breathing is better.  Denied neuropathy but having Prn diarrhea that is managed with OTC medications.  PRN nose bleeds but stopped with pressure.  Wife stated she has noted prn blood in toilet but feels it may be due to hemorrhoids with last cycle of treatment.  No recent blood in stool noted.    Reviewed serum creatinine 2.57 and other lab work with Dr. Delton Coombes with orders to hold treatment today and hydrate with 1Liter NACL.  Pharmacy notified.   PICC line flushed per protocol with dressing change using sterile technique.  No complaints of pain with flush.  Insertion site clean and dry with no drainage, bruising or swelling noted.  No redness or complaints of tenderness.  Red and purple line flushed with nacl and heparin with caps changed.  See MAR.  Patient tolerated hydration with no complaints voiced.  Prn diarrhea during hydration.  VSS with discharge and left via wheelchair with family.  No s/s of distress noted.

## 2017-10-28 NOTE — Patient Instructions (Signed)
[place] at [facility]  Discharge Instructions:  You received hydration today.   _______________________________________________________________  Thank you for choosing [place] at [facility] to provide your oncology and hematology care.  To afford each patient quality time with our providers, please arrive at least 15 minutes before your scheduled appointment.  You need to re-schedule your appointment if you arrive 10 or more minutes late.  We strive to give you quality time with our providers, and arriving late affects you and other patients whose appointments are after yours.  Also, if you no show three or more times for appointments you may be dismissed from the clinic.  Again, thank you for choosing [place] at [place] hope is that these requests will allow you access to exceptional care and in a timely manner. _______________________________________________________________  If you have questions after your visit, please contact our office at [phone] between the hours of 8:30 a.m. and 5:00 p.m. Voicemails left after 4:30 p.m. will not be returned until the following business day. _______________________________________________________________  For prescription refill requests, have your pharmacy contact our office. _______________________________________________________________  Recommendations made by the consultant and any test results will be sent to your referring physician. _______________________________________________________________

## 2017-10-29 ENCOUNTER — Inpatient Hospital Stay (HOSPITAL_COMMUNITY): Payer: Medicare Other

## 2017-10-29 ENCOUNTER — Encounter (HOSPITAL_COMMUNITY): Payer: Self-pay

## 2017-10-29 VITALS — BP 96/49 | HR 98 | Temp 97.6°F | Resp 20 | Wt 143.0 lb

## 2017-10-29 DIAGNOSIS — R7989 Other specified abnormal findings of blood chemistry: Secondary | ICD-10-CM

## 2017-10-29 DIAGNOSIS — Z5112 Encounter for antineoplastic immunotherapy: Secondary | ICD-10-CM | POA: Diagnosis not present

## 2017-10-29 DIAGNOSIS — C3490 Malignant neoplasm of unspecified part of unspecified bronchus or lung: Secondary | ICD-10-CM

## 2017-10-29 LAB — COMPREHENSIVE METABOLIC PANEL
ALBUMIN: 2.6 g/dL — AB (ref 3.5–5.0)
ALK PHOS: 115 U/L (ref 38–126)
ALT: 30 U/L (ref 17–63)
ANION GAP: 16 — AB (ref 5–15)
AST: 27 U/L (ref 15–41)
BUN: 43 mg/dL — ABNORMAL HIGH (ref 6–20)
CALCIUM: 9.1 mg/dL (ref 8.9–10.3)
CO2: 28 mmol/L (ref 22–32)
Chloride: 91 mmol/L — ABNORMAL LOW (ref 101–111)
Creatinine, Ser: 1.4 mg/dL — ABNORMAL HIGH (ref 0.61–1.24)
GFR calc Af Amer: 54 mL/min — ABNORMAL LOW (ref >60)
GFR calc non Af Amer: 47 mL/min — ABNORMAL LOW (ref >60)
GLUCOSE: 114 mg/dL — AB (ref 65–99)
Potassium: 2.9 mmol/L — ABNORMAL LOW (ref 3.5–5.1)
SODIUM: 135 mmol/L (ref 135–145)
Total Bilirubin: 0.9 mg/dL (ref 0.3–1.2)
Total Protein: 6.9 g/dL (ref 6.5–8.1)

## 2017-10-29 MED ORDER — SODIUM CHLORIDE 0.9 % IV SOLN
INTRAVENOUS | Status: DC
  Administered 2017-10-29: 1000 mL via INTRAVENOUS

## 2017-10-29 MED ORDER — SODIUM CHLORIDE 0.9% FLUSH
10.0000 mL | Freq: Once | INTRAVENOUS | Status: AC
  Administered 2017-10-29: 10 mL via INTRAVENOUS

## 2017-10-29 MED ORDER — HEPARIN SOD (PORK) LOCK FLUSH 100 UNIT/ML IV SOLN
500.0000 [IU] | Freq: Once | INTRAVENOUS | Status: AC
  Administered 2017-10-29: 500 [IU] via INTRAVENOUS

## 2017-10-29 NOTE — Progress Notes (Signed)
Labs reviewed with Dr. Delton Coombes, will hold treatment today due to kidney function. Will retry tomorrow with treatment and give hydration fluids today per orders.

## 2017-10-29 NOTE — Progress Notes (Signed)
To treatment area for labs and possible hydration or chemotherapy.  No change in assessment from yesterday.  Patient weak, fatigue, and on Oxygen.    Labs reviewed with Dr. Delton Coombes.  Hydrate today and return tomorrow for lab recheck and possible treatment.    Patient tolerated hydration with no complaints voiced.  PICC line intact with no bruising or swelling noted at site.  Flushed per protocol.  No complaints of pain with flush.  VSS with discharge and left via wheelchair with family.  No s/s of distress noted.

## 2017-10-29 NOTE — Patient Instructions (Signed)
[place] at [facility]  Discharge Instructions:  You received hydration today.  Return tomorrow for labs.  _______________________________________________________________  Thank you for choosing [place] at [facility] to provide your oncology and hematology care.  To afford each patient quality time with our providers, please arrive at least 15 minutes before your scheduled appointment.  You need to re-schedule your appointment if you arrive 10 or more minutes late.  We strive to give you quality time with our providers, and arriving late affects you and other patients whose appointments are after yours.  Also, if you no show three or more times for appointments you may be dismissed from the clinic.  Again, thank you for choosing [place] at [place] hope is that these requests will allow you access to exceptional care and in a timely manner. _______________________________________________________________  If you have questions after your visit, please contact our office at [phone] between the hours of 8:30 a.m. and 5:00 p.m. Voicemails left after 4:30 p.m. will not be returned until the following business day. _______________________________________________________________  For prescription refill requests, have your pharmacy contact our office. _______________________________________________________________  Recommendations made by the consultant and any test results will be sent to your referring physician. _______________________________________________________________

## 2017-10-30 ENCOUNTER — Inpatient Hospital Stay (HOSPITAL_COMMUNITY): Payer: Medicare Other

## 2017-10-30 VITALS — BP 102/48 | HR 90 | Temp 97.0°F | Resp 22

## 2017-10-30 DIAGNOSIS — Z5112 Encounter for antineoplastic immunotherapy: Secondary | ICD-10-CM | POA: Diagnosis not present

## 2017-10-30 DIAGNOSIS — C3490 Malignant neoplasm of unspecified part of unspecified bronchus or lung: Secondary | ICD-10-CM

## 2017-10-30 LAB — CBC WITH DIFFERENTIAL/PLATELET
BASOS ABS: 0 10*3/uL (ref 0.0–0.1)
BASOS PCT: 0 %
Eosinophils Absolute: 0.1 10*3/uL (ref 0.0–0.7)
Eosinophils Relative: 1 %
HEMATOCRIT: 26.5 % — AB (ref 39.0–52.0)
HEMOGLOBIN: 8.7 g/dL — AB (ref 13.0–17.0)
Lymphocytes Relative: 20 %
Lymphs Abs: 1.7 10*3/uL (ref 0.7–4.0)
MCH: 29.2 pg (ref 26.0–34.0)
MCHC: 32.8 g/dL (ref 30.0–36.0)
MCV: 88.9 fL (ref 78.0–100.0)
Monocytes Absolute: 1.3 10*3/uL — ABNORMAL HIGH (ref 0.1–1.0)
Monocytes Relative: 16 %
NEUTROS PCT: 63 %
Neutro Abs: 5.4 10*3/uL (ref 1.7–7.7)
Platelets: 336 10*3/uL (ref 150–400)
RBC: 2.98 MIL/uL — AB (ref 4.22–5.81)
RDW: 13.7 % (ref 11.5–15.5)
WBC: 8.6 10*3/uL (ref 4.0–10.5)

## 2017-10-30 LAB — COMPREHENSIVE METABOLIC PANEL
ALBUMIN: 2.5 g/dL — AB (ref 3.5–5.0)
ALK PHOS: 102 U/L (ref 38–126)
ALT: 25 U/L (ref 17–63)
AST: 24 U/L (ref 15–41)
Anion gap: 12 (ref 5–15)
BILIRUBIN TOTAL: 0.5 mg/dL (ref 0.3–1.2)
BUN: 32 mg/dL — AB (ref 6–20)
CO2: 30 mmol/L (ref 22–32)
Calcium: 8.8 mg/dL — ABNORMAL LOW (ref 8.9–10.3)
Chloride: 92 mmol/L — ABNORMAL LOW (ref 101–111)
Creatinine, Ser: 0.91 mg/dL (ref 0.61–1.24)
GFR calc Af Amer: >60 mL/min (ref >60)
GFR calc non Af Amer: >60 mL/min (ref >60)
GLUCOSE: 115 mg/dL — AB (ref 65–99)
POTASSIUM: 3 mmol/L — AB (ref 3.5–5.1)
Sodium: 134 mmol/L — ABNORMAL LOW (ref 135–145)
TOTAL PROTEIN: 6.3 g/dL — AB (ref 6.5–8.1)

## 2017-10-30 MED ORDER — PACLITAXEL PROTEIN-BOUND CHEMO INJECTION 100 MG
75.0000 mg/m2 | Freq: Once | Status: AC
  Administered 2017-10-30: 150 mg via INTRAVENOUS
  Filled 2017-10-30: qty 30

## 2017-10-30 MED ORDER — POTASSIUM CHLORIDE CRYS ER 20 MEQ PO TBCR: 20.0000 meq | EXTENDED_RELEASE_TABLET | Freq: Every day | ORAL | 2 refills | Status: AC

## 2017-10-30 MED ORDER — PROCHLORPERAZINE MALEATE 10 MG PO TABS
10.0000 mg | ORAL_TABLET | Freq: Once | ORAL | Status: AC
  Administered 2017-10-30: 10 mg via ORAL
  Filled 2017-10-30: qty 1

## 2017-10-30 MED ORDER — SODIUM CHLORIDE 0.9% FLUSH: 10.0000 mL | INTRAVENOUS | Status: DC | PRN

## 2017-10-30 MED ORDER — POTASSIUM CHLORIDE CRYS ER 20 MEQ PO TBCR
40.0000 meq | EXTENDED_RELEASE_TABLET | Freq: Once | ORAL | Status: AC
  Administered 2017-10-30: 40 meq via ORAL
  Filled 2017-10-30: qty 2

## 2017-10-30 MED ORDER — HEPARIN SOD (PORK) LOCK FLUSH 100 UNIT/ML IV SOLN
250.0000 [IU] | Freq: Once | INTRAVENOUS | Status: DC | PRN
  Filled 2017-10-30: qty 5

## 2017-10-30 MED ORDER — SODIUM CHLORIDE 0.9 % IV SOLN
Freq: Once | INTRAVENOUS | Status: AC
  Administered 2017-10-30: 12:00:00 via INTRAVENOUS

## 2017-10-30 NOTE — Progress Notes (Signed)
Labs reviewed with Dr. Delton Coombes.  Okay to tx today per MD.  Orders rec'd for K-Dur 40 mEq x 1 dose today; will e-scribe K-Dur 20 mEq po daily per MD.   Tolerated infusion w/o adverse reaction.  Alert, in no distress.  VSS.  Discharged via wheelchair in c/o spouse.

## 2017-10-30 NOTE — Progress Notes (Signed)
Diagnosis Squamous carcinoma of lung, unspecified laterality ([place]) - Plan: CBC with Differential/Platelet, Comprehensive metabolic panel, Lactate dehydrogenase, dexamethasone (DECADRON) 20 mg in sodium chloride 0.9 % 50 mL IVPB, cefTRIAXone (ROCEPHIN) 1 g in sodium chloride 0.9 % 100 mL IVPB, Practitioner attestation of consent, Complete patient signature process for consent form, Care order/instruction, Type and screen, Prepare RBC, Prepare RBC, Type and screen, Care order/instruction, Complete patient signature process for consent form, Practitioner attestation of consent, DISCONTINUED: 0.9 %  sodium chloride infusion, DISCONTINUED: sodium chloride flush (NS) 0.9 % injection 10 mL, DISCONTINUED: diphenhydrAMINE (BENADRYL) capsule 25 mg, DISCONTINUED: acetaminophen (TYLENOL) tablet 650 mg, CANCELED: Transfuse RBC  Cough - Plan: dexamethasone (DECADRON) 20 mg in sodium chloride 0.9 % 50 mL IVPB, cefTRIAXone (ROCEPHIN) 1 g in sodium chloride 0.9 % 100 mL IVPB  Dyspnea, unspecified type - Plan: dexamethasone (DECADRON) 20 mg in sodium chloride 0.9 % 50 mL IVPB, cefTRIAXone (ROCEPHIN) 1 g in sodium chloride 0.9 % 100 mL IVPB  Low hemoglobin - Plan: Practitioner attestation of consent, Complete patient signature process for consent form, Care order/instruction, Type and screen, Prepare RBC, Prepare RBC, Type and screen, Care order/instruction, Complete patient signature process for consent form, Practitioner attestation of consent, DISCONTINUED: 0.9 %  sodium chloride infusion, DISCONTINUED: sodium chloride flush (NS) 0.9 % injection 10 mL, DISCONTINUED: diphenhydrAMINE (BENADRYL) capsule 25 mg, DISCONTINUED: acetaminophen (TYLENOL) tablet 650 mg, CANCELED: Transfuse RBC  Staging Cancer Staging Squamous carcinoma of lung, unspecified laterality ([place]) Staging form: Lung, AJCC 8th Edition - Clinical stage from 10/14/2017: Stage IV (pM1a) - Signed by Derek Jack, MD on 10/14/2017   Assessment and  Plan:  1.  Stage IV non-small cell lung cancer.  The patient originally had an abnormal scan done in May 2018 which showed densities in the right upper lobe and posterior lateral left upper lobe.  Unclear why patient never had a biopsy performed based on his initial evaluation in May 2018. He had a PET scan done 02/02/2017 that showed IMPRESSION: 1. Mass within the posteromedial left upper lobe is suspicious for primary bronchogenic carcinoma. 2. Hypermetabolic left hilar, left paratracheal and right paratracheal lymph nodes worrisome for metastatic disease. 3. Perifissural nodularity within the posterior left upper lobe and parenchymal nodule is identified within the right upper lobe. Both of these exhibit mild increased FDG uptake. Cannot rule out metastatic disease. 4. Aortic Atherosclerosis (ICD10-I70.0). Ectatic abdominal aorta at risk for aneurysm development. Recommend followup by ultrasound in 5 years. This recommendation follows ACR consensus guidelines: White Paper of the ACR Incidental Findings Committee II on Vascular Findings. J Am Coll Radiol 2013; 10:789-794. 5. Multi vessel coronary artery calcification 6. Hyperdense lesion arising from inferior pole of left kidney is incompletely characterized without IV contrast. This may represent a hemorrhagic cyst, proteinaceous cyst or solid kidney lesion.   He subsequently had a CT of the chest done 08/26/2017 that showed interval increase in mediastinal LN suggestive of progression of malignancy.  There was also interval increase in the size of the process in the left lower lobe which was concerning for persistent malignancy.  There was a small nodule along the left oblique fissure.  There was also a small nodule in the right horizontal fissure that was new.  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  Pt was set up for Pet scan for staging and Pet scan done  09/16/2017 shows IMPRESSION: 1. Progressive cavitary, hypermetabolic primary neoplasm medially in  the left lower lobe. The separate small focus of hypermetabolic activity along the superior aspect of the left major fissure has not significantly changed. 2. 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 3. Progressive widespread hypermetabolic mediastinal lymphadenopathy. 4. No evidence of subdiaphragmatic or osseous metastatic disease. 5. Stable incidental findings including a left thyroid nodule and extensive Aortic Atherosclerosis (ICD10-I70.0).  Findings on PET consistent with progressive and advanced lung cancer.  He was recommended for MRI of the brain but did not have that performed.  He is willing to have CT of brain done 09/17/2017 that shows no IC mets.  Due to worsening symptoms, treatment was expedited  with Pem/Abraxane/Carboplatin based on results of Keynote- 407 trial in [facility] which showed improved OS, PFS, RR and duration of response.    Pembro will begin with C2 and is dosed 200 mg with Carboplatin AUC 6 every 3 weeks, Abraxane 100 mg/m2 weekly  Pt is planned for 4 cycles.  Will then be set up for repeat imaging and Pending results may continue Pembro.    Pt is here for D8 of Abraxane.  Will monitor TFTs as therapy proceeds.   Labs done today 09/30/2017 white count 10.3 hemoglobin 7.7 platelets 260,000.  Creatinine is 1.  The patient will be followed closely as treatment proceeds.  Will hold on beginning [place] until cycle 2 in order to assess tolerance to therapy.    2.  Anemia.  Hemoglobin is 7.7.  Patient will be transfused with PRBCs today.    3.  Cough.  He is afebrile in clinic.  White count was 10.3.  He was given a dose of Rocephin in clinic today.  4.  Hoarseness.  This is likely secondary to advanced squamous cell lung cancer.  He denies any trouble swallowing but reports some minor shortness of breath.  Will monitor this as treatment proceeds.  5.  COPD.   He has a long smoking history.  He is followed by Dr. Luan Pulling.  He is complaining of cough and shortness of breath today.  He will receive a dose of Rocephin in clinic today due to possible bronchitis.  Interval History:  77 y.o. male with past medical history of COPD, chronic cough, HTN, HLD, who is followed by pulmonologist for lung nodule.   Patient underwent CTA Chest 5/8/218 who showed: 1.  No evidence for pulmonary embolism 2.  Enlarging nodular densities involving the anterior right upper lobe and posterior lateral left upper lobe.  3.  Irregular groundglass consolidation involving the posterior medial left lower lobe.  Pt had prior PET scan done 02/02/2017 that showed IMPRESSION: 1. Mass within the posteromedial left upper lobe is suspicious for primary bronchogenic carcinoma. 2. Hypermetabolic left hilar, left paratracheal and right paratracheal lymph nodes worrisome for metastatic disease. 3. Perifissural nodularity within the posterior left upper lobe and parenchymal nodule is identified within the right upper lobe. Both of these exhibit mild increased FDG uptake. Cannot rule out metastatic disease. 4. Aortic Atherosclerosis (ICD10-I70.0). Ectatic abdominal aorta at risk for aneurysm development. Recommend followup by ultrasound in 5 years. This recommendation follows ACR consensus guidelines: White Paper of the ACR Incidental Findings Committee II on Vascular Findings. J Am Coll Radiol 2013; 10:789-794. 5. Multi vessel coronary artery calcification 6. Hyperdense lesion arising from inferior pole of left kidney is incompletely characterized without IV contrast. This may represent a hemorrhagic cyst, proteinaceous cyst or solid kidney lesion.   IR consulted for lung mass biopsy at the request of Dr. Luan Pulling.  Case  reviewed by Dr. Earleen Newport who has approved patient for LLL mass biopsy.   Unclear if patient ever had a biopsy performed at his initial evaluation in 11/2016.   Patient had a  CT of the chest done on 08/26/2017 which showed interval increase in mediastinal lymph nodes concerning for progression of malignancy.  There was also interval increase in size of the process in the left lower lobe.  These were concerning for persistent malignancy.  There was also a small nodule along the left oblique fissure fissure.  There was also a small nodule on the right horizontal fissure that was new.  He underwent biopsy of the left lung lesion on 09/04/2017 that showed poorly differentiated squamous cell cancer.  He reports he has noted some hoarseness and  weight loss.     Current Status:  Pt is seen today for follow-up prior to Abraxane.  He is reporting more shortness of breath and cough.      Squamous carcinoma of lung, unspecified laterality ([place])   09/17/2017 Initial Diagnosis    Squamous carcinoma of lung, unspecified laterality ([place])      10/14/2017 Cancer Staging    Staging form: Lung, AJCC 8th Edition - Clinical stage from 10/14/2017: Stage IV (pM1a) - Signed by Derek Jack, MD on 10/14/2017        Problem List Patient Active Problem List   Diagnosis Date Noted  . Squamous carcinoma of lung, unspecified laterality ([place]) [C34.90] 09/17/2017    Past Medical History Past Medical History:  Diagnosis Date  . Anxiety   . Bipolar disorder ([place])   . Cancer ([place])    skin cancer  . COPD (chronic obstructive pulmonary disease) ([place])   . GERD (gastroesophageal reflux disease)   . HOH (hard of hearing)   . Hypertension     Past Surgical History Past Surgical History:  Procedure Laterality Date  . APPENDECTOMY    . CATARACT EXTRACTION, BILATERAL Bilateral   . FLEXIBLE BRONCHOSCOPY N/A 09/04/2017   Procedure: FLEXIBLE BRONCHOSCOPY WITH PROPOFOL;  Surgeon: Sinda Du, MD;  [facility]: [facility];  Service: Cardiopulmonary;  Laterality: N/A;  . SKIN CANCER EXCISION     hand    Family History Family History  Problem Relation Age of Onset  .  Heart attack Mother   . Stroke Father   . Leukemia Brother   . Other Brother      Social History  reports that he has been smoking cigarettes.  He has a 60.00 pack-year smoking history. He has never used smokeless tobacco. He reports that he drinks alcohol. He reports that he does not use drugs.  Medications  Current Outpatient Medications:  .  albuterol (PROVENTIL HFA;VENTOLIN HFA) 108 (90 Base) MCG/ACT inhaler, Inhale 1 puff into the lungs every 6 (six) hours as needed for wheezing or shortness of breath., Disp: , Rfl:  .  amLODipine (NORVASC) 5 MG tablet, Take 5 mg by mouth daily., Disp: , Rfl:  .  atorvastatin (LIPITOR) 10 MG tablet, Take 10 mg by mouth daily. , Disp: , Rfl:  .  baclofen (LIORESAL) 10 MG tablet, Take 10 mg by mouth daily as needed for muscle spasms., Disp: , Rfl:  .  Capsaicin (CAPZASIN-HP EX), Apply 1 application topically daily as needed (pain)., Disp: , Rfl:  .  CARBOPLATIN IV, Inject into the vein., Disp: , Rfl:  .  co-enzyme Q-10 30 MG capsule, Take 30 mg by mouth 2 (two) times daily., Disp: , Rfl:  .  dronabinol (MARINOL) 5  MG capsule, Take 1 capsule (5 mg total) by mouth 2 (two) times daily before lunch and supper., Disp: 60 capsule, Rfl: 2 .  DULoxetine (CYMBALTA) 60 MG capsule, Take 60 mg by mouth daily., Disp: , Rfl:  .  Fluticasone-Umeclidin-Vilant (TRELEGY ELLIPTA) 100-62.5-25 MCG/INH AEPB, Inhale 1 puff into the lungs at bedtime., Disp: , Rfl:  .  Guaifenesin 1200 MG TB12, Take 1,200 mg by mouth daily., Disp: , Rfl:  .  Krill Oil 500 MG CAPS, Take 500 mg by mouth at bedtime., Disp: , Rfl:  .  LORazepam (ATIVAN) 1 MG tablet, Take 1 pill by mouth the day of procedure about 30 minutes before you arrive., Disp: 1 tablet, Rfl: 0 .  losartan-hydrochlorothiazide (HYZAAR) 100-12.5 MG tablet, Take 1 tablet by mouth daily., Disp: , Rfl:  .  Misc Natural Products (OSTEO BI-FLEX ADV TRIPLE ST PO), Take 1 tablet by mouth 2 (two) times daily., Disp: , Rfl:  .  Multiple  Vitamin (MULTIVITAMIN WITH MINERALS) TABS tablet, Take 1 tablet by mouth daily. One-A-Day Men's 50+, Disp: , Rfl:  .  naproxen sodium (ALEVE) 220 MG tablet, Take 220 mg by mouth daily as needed (pain)., Disp: , Rfl:  .  ondansetron (ZOFRAN) 8 MG tablet, Take 1 tablet (8 mg total) by mouth 2 (two) times daily as needed for refractory nausea / vomiting. Start on day 3 after carboplatin chemo., Disp: 30 tablet, Rfl: 1 .  PACLitaxel Protein-Bound Part (ABRAXANE IV), Inject into the vein., Disp: , Rfl:  .  Pembrolizumab (KEYTRUDA IV), Inject into the vein., Disp: , Rfl:  .  potassium chloride SA (K-DUR,KLOR-CON) 20 MEQ tablet, Take 1 tablet (20 mEq total) by mouth daily., Disp: 30 tablet, Rfl: 2 .  prochlorperazine (COMPAZINE) 10 MG tablet, Take 1 tablet (10 mg total) by mouth every 6 (six) hours as needed (Nausea or vomiting)., Disp: 30 tablet, Rfl: 1 .  ranitidine (ZANTAC) 150 MG tablet, Take 150 mg by mouth 2 (two) times daily as needed for heartburn. , Disp: , Rfl:  .  sodium chloride flush 0.9 % SOLN injection, Inject 10 mLs into the vein 2 (two) times a week. Flush 10 mLs  into each catheter of PICC line twice weekly.  (double lumen), Disp: 30 Syringe, Rfl: 3 .  UNABLE TO FIND, Take 200 mLs by mouth 4 (four) times daily as needed (Four times a day as needed). Med Name: Magic Mouthwash  Maalox and Lidocaine 2% mix 1:1 37m swish and spit QID PRN 200 ml with 2 refills Dr. KDelton Coombes Disp: , Rfl:   Allergies Patient has no known allergies.  Review of Systems Review of Systems - Oncology ROS as per HPI otherwise 12 point ROS is negative.   Physical Exam  Vitals Wt Readings from Last 3 Encounters:  10/29/17 143 lb (64.9 kg)  10/28/17 143 lb 6.4 oz (65 kg)  10/22/17 151 lb 3.2 oz (68.6 kg)   Temp Readings from Last 3 Encounters:  10/30/17 (!) 97 F (36.1 C) (Tympanic)  10/29/17 97.6 F (36.4 C) (Oral)  10/28/17 97.8 F (36.6 C) (Oral)   BP Readings from Last 3 Encounters:  10/30/17 (!)  102/48  10/29/17 (!) 96/49  10/28/17 (!) 95/53   Pulse Readings from Last 3 Encounters:  10/30/17 90  10/29/17 98  10/28/17 73    Constitutional: Well-developed, well-nourished, and in no distress.   HENT: Head: Normocephalic and atraumatic.  Mouth/Throat: No oropharyngeal exudate. Mucosa moist. Eyes: Pupils are equal, round, and reactive to light.  Conjunctivae are normal. No scleral icterus.  Neck: Normal range of motion. Neck supple. No JVD present.  Cardiovascular: Normal rate, regular rhythm and normal heart sounds.  Exam reveals no gallop and no friction rub.   No murmur heard. Pulmonary/Chest: Effort normal and breath sounds normal.  Coarse breath sounds, few wheezes.   Abdominal: Soft. Bowel sounds are normal. No distension. There is no tenderness. There is no guarding.  Musculoskeletal: No edema or tenderness.  Lymphadenopathy:    No cervical or supraclavicular adenopathy.  Neurological: Alert and oriented to person, place, and time. No cranial nerve deficit.  Skin: Skin is warm and dry. No rash noted. No erythema. No pallor.  Psychiatric: Affect and judgment normal.   Labs Office Visit on 09/30/2017  Component Date Value Ref Range Status  . Order Confirmation 09/30/2017    Final                   Value:ORDER PROCESSED BY BLOOD BANK Performed at [facility], [address], [place], [place] [address]   . ABO/RH(D) 09/30/2017 B NEG   Final  . Antibody Screen 09/30/2017 NEG   Final  . Sample Expiration 09/30/2017 10/03/2017   Final  . Unit Number 09/30/2017 [id number]   Final  . Blood Component Type 09/30/2017 RED CELLS,LR   Final  . Unit division 09/30/2017 00   Final  . Status of Unit 09/30/2017 REL FROM [facility]   Final  . Transfusion Status 09/30/2017 OK TO TRANSFUSE   Final  . Crossmatch Result 09/30/2017    Final                   Value:Compatible Performed at [facility], [address], [place], [place] [address]   . Unit Number 09/30/2017 [id number]    Final  . Blood Component Type 09/30/2017 RED CELLS,LR   Final  . Unit division 09/30/2017 00   Final  . Status of Unit 09/30/2017 ISSUED,FINAL   Final  . Transfusion Status 09/30/2017 OK TO TRANSFUSE   Final  . Crossmatch Result 09/30/2017 Compatible   Final  . Blood Product Unit Number 09/30/2017 [id number]   Final  . Unit Type and Rh 09/30/2017 9500   Final  . Blood Product Expiration Date 09/30/2017 [id number]   Final  . ISSUE DATE / TIME 09/30/2017 [id number]   Final  . Blood Product Unit Number 09/30/2017 [id number]   Final  . PRODUCT CODE 09/30/2017 [id number]   Final  . Unit Type and Rh 09/30/2017 9500   Final  . Blood Product Expiration Date 09/30/2017 [id number]   Final  Infusion on 09/30/2017  Component Date Value Ref Range Status  . LDH 09/30/2017 142  98 - 192 U/L Final   Performed at [facility], [address] [place], [place] [address]  . Sodium 09/30/2017 127* 135 - 145 mmol/L Final  . Potassium 09/30/2017 3.5  3.5 - 5.1 mmol/L Final  . Chloride 09/30/2017 89* 101 - 111 mmol/L Final  . CO2 09/30/2017 26  22 - 32 mmol/L Final  . Glucose, Bld 09/30/2017 130* 65 - 99 mg/dL Final  . BUN 09/30/2017 29* 6 - 20 mg/dL Final  . Creatinine, Ser 09/30/2017 1.02  0.61 - 1.24 mg/dL Final  . Calcium 09/30/2017 9.2  8.9 - 10.3 mg/dL Final  . Total Protein 09/30/2017 6.7  6.5 - 8.1 g/dL Final  . Albumin 09/30/2017 2.5* 3.5 - 5.0 g/dL Final  . AST 09/30/2017 21  15 - 41 U/L Final  .  ALT 09/30/2017 16* 17 - 63 U/L Final  . Alkaline Phosphatase 09/30/2017 97  38 - 126 U/L Final  . Total Bilirubin 09/30/2017 0.9  0.3 - 1.2 mg/dL Final  . GFR calc non Af Amer 09/30/2017 >60  >60 mL/min Final  . GFR calc Af Amer 09/30/2017 >60  >60 mL/min Final   Comment: (NOTE) The eGFR has been calculated using the CKD EPI equation. This calculation has not been validated in all clinical situations. eGFR's persistently <60 mL/min signify possible Chronic Kidney Disease.   Georgiann Hahn gap  09/30/2017 12  5 - 15 Final   Performed at [facility], [address], [place], [place] [address]  . WBC 09/30/2017 10.3  4.0 - 10.5 K/uL Final  . RBC 09/30/2017 2.66* 4.22 - 5.81 MIL/uL Final  . Hemoglobin 09/30/2017 7.7* 13.0 - 17.0 g/dL Final  . HCT 09/30/2017 23.7* 39.0 - 52.0 % Final  . MCV 09/30/2017 89.1  78.0 - 100.0 fL Final  . MCH 09/30/2017 28.9  26.0 - 34.0 pg Final  . MCHC 09/30/2017 32.5  30.0 - 36.0 g/dL Final  . RDW 09/30/2017 12.8  11.5 - 15.5 % Final  . Platelets 09/30/2017 260  150 - 400 K/uL Final  . Neutrophils Relative % 09/30/2017 76  % Final  . Neutro Abs 09/30/2017 7.8* 1.7 - 7.7 K/uL Final  . Lymphocytes Relative 09/30/2017 11  % Final  . Lymphs Abs 09/30/2017 1.1  0.7 - 4.0 K/uL Final  . Monocytes Relative 09/30/2017 13  % Final  . Monocytes Absolute 09/30/2017 1.3* 0.1 - 1.0 K/uL Final  . Eosinophils Relative 09/30/2017 0  % Final  . Eosinophils Absolute 09/30/2017 0.0  0.0 - 0.7 K/uL Final  . Basophils Relative 09/30/2017 0  % Final  . Basophils Absolute 09/30/2017 0.0  0.0 - 0.1 K/uL Final   Performed at [facility], [address] [place], [place] [address]  . ABO/RH(D) 09/30/2017    Final                   Value:B NEG Performed at [facility], [address] [place], [place] [address]      Pathology Orders Placed This Encounter  Procedures  . CBC with Differential/Platelet    Standing Status:   Future    Number of Occurrences:   1    Standing Expiration Date:   10/01/2018  . Comprehensive metabolic panel    Standing Status:   Future    Number of Occurrences:   1    Standing Expiration Date:   10/01/2018  . Lactate dehydrogenase    Standing Status:   Future    Number of Occurrences:   1    Standing Expiration Date:   10/01/2018  . Practitioner attestation of consent    I, the ordering practitioner, attest that I have discussed with the patient the benefits, risks, side effects, alternatives, likelihood of achieving goals and potential  problems during recovery for the procedure listed.    Standing Status:   Future    Number of Occurrences:   1    Standing Expiration Date:   09/30/2018    Order Specific Question:   Procedure    Answer:   Blood Product(s)  . Complete patient signature process for consent form    Standing Status:   Future    Number of Occurrences:   1    Standing Expiration Date:   09/30/2018  . Care order/instruction    Transfuse Parameters  Standing Status:   Future    Number of Occurrences:   1    Standing Expiration Date:   09/30/2018  . Type and screen    Standing Status:   Future    Number of Occurrences:   1    Standing Expiration Date:   09/30/2018  . Prepare RBC    Standing Status:   Standing    Number of Occurrences:   1    Order Specific Question:   # of Units    Answer:   1 unit    Order Specific Question:   Transfusion Indications    Answer:   Symptomatic Anemia    Order Specific Question:   If emergent release call blood bank    Answer:   Not emergent release    Order Specific Question:   Date/Time blood product needed    Answer:   10/01/17       Zoila Shutter MD

## 2017-11-05 ENCOUNTER — Inpatient Hospital Stay (HOSPITAL_COMMUNITY): Payer: Medicare Other

## 2017-11-05 ENCOUNTER — Encounter (HOSPITAL_COMMUNITY): Payer: Self-pay

## 2017-11-05 VITALS — BP 105/60 | HR 98 | Temp 96.7°F | Resp 20 | Wt 142.3 lb

## 2017-11-05 DIAGNOSIS — Z5112 Encounter for antineoplastic immunotherapy: Secondary | ICD-10-CM | POA: Diagnosis not present

## 2017-11-05 DIAGNOSIS — IMO0002 Reserved for concepts with insufficient information to code with codable children: Secondary | ICD-10-CM

## 2017-11-05 DIAGNOSIS — R7989 Other specified abnormal findings of blood chemistry: Secondary | ICD-10-CM

## 2017-11-05 DIAGNOSIS — C3490 Malignant neoplasm of unspecified part of unspecified bronchus or lung: Secondary | ICD-10-CM

## 2017-11-05 LAB — COMPREHENSIVE METABOLIC PANEL
ALBUMIN: 2.6 g/dL — AB (ref 3.5–5.0)
ALT: 21 U/L (ref 17–63)
ANION GAP: 14 (ref 5–15)
AST: 20 U/L (ref 15–41)
Alkaline Phosphatase: 116 U/L (ref 38–126)
BUN: 29 mg/dL — ABNORMAL HIGH (ref 6–20)
CO2: 26 mmol/L (ref 22–32)
Calcium: 8.7 mg/dL — ABNORMAL LOW (ref 8.9–10.3)
Chloride: 93 mmol/L — ABNORMAL LOW (ref 101–111)
Creatinine, Ser: 2.03 mg/dL — ABNORMAL HIGH (ref 0.61–1.24)
GFR calc Af Amer: 35 mL/min — ABNORMAL LOW (ref >60)
GFR calc non Af Amer: 30 mL/min — ABNORMAL LOW (ref >60)
GLUCOSE: 133 mg/dL — AB (ref 65–99)
Potassium: 3.6 mmol/L (ref 3.5–5.1)
SODIUM: 133 mmol/L — AB (ref 135–145)
TOTAL PROTEIN: 6.6 g/dL (ref 6.5–8.1)
Total Bilirubin: 0.5 mg/dL (ref 0.3–1.2)

## 2017-11-05 LAB — CBC WITH DIFFERENTIAL/PLATELET
BASOS ABS: 0 10*3/uL (ref 0.0–0.1)
BASOS PCT: 0 %
Eosinophils Absolute: 0.1 10*3/uL (ref 0.0–0.7)
Eosinophils Relative: 1 %
HEMATOCRIT: 27.1 % — AB (ref 39.0–52.0)
HEMOGLOBIN: 8.8 g/dL — AB (ref 13.0–17.0)
Lymphocytes Relative: 19 %
Lymphs Abs: 1.4 10*3/uL (ref 0.7–4.0)
MCH: 28.9 pg (ref 26.0–34.0)
MCHC: 32.5 g/dL (ref 30.0–36.0)
MCV: 88.9 fL (ref 78.0–100.0)
MONO ABS: 0.7 10*3/uL (ref 0.1–1.0)
MONOS PCT: 10 %
NEUTROS ABS: 5.2 10*3/uL (ref 1.7–7.7)
NEUTROS PCT: 70 %
Platelets: 296 10*3/uL (ref 150–400)
RBC: 3.05 MIL/uL — ABNORMAL LOW (ref 4.22–5.81)
RDW: 14.4 % (ref 11.5–15.5)
WBC: 7.3 10*3/uL (ref 4.0–10.5)

## 2017-11-05 LAB — LACTATE DEHYDROGENASE: LDH: 160 U/L (ref 98–192)

## 2017-11-05 MED ORDER — HEPARIN SOD (PORK) LOCK FLUSH 100 UNIT/ML IV SOLN
500.0000 [IU] | Freq: Once | INTRAVENOUS | Status: AC
  Administered 2017-11-05: 500 [IU] via INTRAVENOUS
  Filled 2017-11-05: qty 5

## 2017-11-05 MED ORDER — SODIUM CHLORIDE 0.9 % IV SOLN
INTRAVENOUS | Status: DC
  Administered 2017-11-05: 1000 mL via INTRAVENOUS

## 2017-11-05 MED ORDER — SODIUM CHLORIDE 0.9% FLUSH
10.0000 mL | Freq: Once | INTRAVENOUS | Status: AC
  Administered 2017-11-05: 10 mL via INTRAVENOUS

## 2017-11-05 NOTE — Progress Notes (Signed)
To treatment area for labs and chemotherapy.  Per wife the patients appetite has improved with hydration.  PRN nausea.  Still prn diarrhea that is controlled by OTC medications.  Denied pain.  Stated SOB is improved but continues to use oxygen.  Neuropathy per patient but he does drop items but he was doing this before treatment.  No worsening of neuropathy.    Reviewed labs with Dr. Delton Coombes.  Hold treatment today and hydrate with NS over 3 hours and repeat labs next Monday or Tuesday for possible hydration.  Pharmacy notified and family.   Patient tolerated hydration with no complaints voiced.  PICC line dressing and caps changed.  Both lines flushed with saline and heparin.  See MAR.  Insertion site dry with no drainage.  Noted small amount of pink/redness at insertion site.  No complaints of pain with flush, touch, or dressing change.  Old biopatch clean and dry with no drainage noted.  New dressing applied.  Patient discharged in stable condition with no s/s of distress noted.

## 2017-11-05 NOTE — Patient Instructions (Signed)
[place] at [facility]  Discharge Instructions:  You received hydration today.  Return on Tuesday for lab check and possible hydration again.  _______________________________________________________________  Thank you for choosing [place] at [facility] to provide your oncology and hematology care.  To afford each patient quality time with our providers, please arrive at least 15 minutes before your scheduled appointment.  You need to re-schedule your appointment if you arrive 10 or more minutes late.  We strive to give you quality time with our providers, and arriving late affects you and other patients whose appointments are after yours.  Also, if you no show three or more times for appointments you may be dismissed from the clinic.  Again, thank you for choosing [place] at [place] hope is that these requests will allow you access to exceptional care and in a timely manner. _______________________________________________________________  If you have questions after your visit, please contact our office at [phone] between the hours of 8:30 a.m. and 5:00 p.m. Voicemails left after 4:30 p.m. will not be returned until the following business day. _______________________________________________________________  For prescription refill requests, have your pharmacy contact our office. _______________________________________________________________  Recommendations made by the consultant and any test results will be sent to your referring physician. _______________________________________________________________

## 2017-11-10 ENCOUNTER — Inpatient Hospital Stay (HOSPITAL_COMMUNITY): Payer: Medicare Other

## 2017-11-10 ENCOUNTER — Encounter (HOSPITAL_COMMUNITY): Payer: Self-pay

## 2017-11-10 ENCOUNTER — Telehealth (HOSPITAL_COMMUNITY): Payer: Self-pay

## 2017-11-10 DIAGNOSIS — Z5112 Encounter for antineoplastic immunotherapy: Secondary | ICD-10-CM | POA: Diagnosis not present

## 2017-11-10 DIAGNOSIS — C3490 Malignant neoplasm of unspecified part of unspecified bronchus or lung: Secondary | ICD-10-CM

## 2017-11-10 LAB — COMPREHENSIVE METABOLIC PANEL
ALT: 28 U/L (ref 17–63)
AST: 41 U/L (ref 15–41)
Albumin: 2.6 g/dL — ABNORMAL LOW (ref 3.5–5.0)
Alkaline Phosphatase: 219 U/L — ABNORMAL HIGH (ref 38–126)
Anion gap: 13 (ref 5–15)
BUN: 29 mg/dL — ABNORMAL HIGH (ref 6–20)
CO2: 27 mmol/L (ref 22–32)
Calcium: 8.3 mg/dL — ABNORMAL LOW (ref 8.9–10.3)
Chloride: 92 mmol/L — ABNORMAL LOW (ref 101–111)
Creatinine, Ser: 1.41 mg/dL — ABNORMAL HIGH (ref 0.61–1.24)
GFR calc Af Amer: 54 mL/min — ABNORMAL LOW (ref >60)
GFR calc non Af Amer: 47 mL/min — ABNORMAL LOW (ref >60)
Glucose, Bld: 125 mg/dL — ABNORMAL HIGH (ref 65–99)
Potassium: 3.1 mmol/L — ABNORMAL LOW (ref 3.5–5.1)
Sodium: 132 mmol/L — ABNORMAL LOW (ref 135–145)
Total Bilirubin: 0.7 mg/dL (ref 0.3–1.2)
Total Protein: 6.6 g/dL (ref 6.5–8.1)

## 2017-11-10 LAB — CBC WITH DIFFERENTIAL/PLATELET
BASOS ABS: 0 10*3/uL (ref 0.0–0.1)
Basophils Relative: 0 %
Eosinophils Absolute: 0.1 10*3/uL (ref 0.0–0.7)
Eosinophils Relative: 1 %
HCT: 28.7 % — ABNORMAL LOW (ref 39.0–52.0)
Hemoglobin: 9.5 g/dL — ABNORMAL LOW (ref 13.0–17.0)
Lymphocytes Relative: 20 %
Lymphs Abs: 2.4 10*3/uL (ref 0.7–4.0)
MCH: 29.1 pg (ref 26.0–34.0)
MCHC: 33.1 g/dL (ref 30.0–36.0)
MCV: 88 fL (ref 78.0–100.0)
MONO ABS: 1.8 10*3/uL — AB (ref 0.1–1.0)
Monocytes Relative: 15 %
NEUTROS PCT: 64 %
Neutro Abs: 7.7 10*3/uL (ref 1.7–7.7)
PLATELETS: 287 10*3/uL (ref 150–400)
RBC: 3.26 MIL/uL — AB (ref 4.22–5.81)
RDW: 14.9 % (ref 11.5–15.5)
WBC: 12 10*3/uL — AB (ref 4.0–10.5)

## 2017-11-10 MED ORDER — HEPARIN SOD (PORK) LOCK FLUSH 100 UNIT/ML IV SOLN
250.0000 [IU] | Freq: Once | INTRAVENOUS | Status: AC
  Administered 2017-11-10: 250 [IU] via INTRAVENOUS

## 2017-11-10 MED ORDER — SODIUM CHLORIDE 0.9% FLUSH: 10.0000 mL | INTRAVENOUS | Status: DC | PRN

## 2017-11-10 MED ORDER — HEPARIN SOD (PORK) LOCK FLUSH 100 UNIT/ML IV SOLN
500.0000 [IU] | Freq: Once | INTRAVENOUS | Status: DC
Start: 2017-11-10 — End: 2017-11-10

## 2017-11-10 MED ORDER — SODIUM CHLORIDE 0.9% FLUSH
3.0000 mL | INTRAVENOUS | Status: DC | PRN
  Administered 2017-11-10: 3 mL via INTRAVENOUS
  Filled 2017-11-10: qty 10

## 2017-11-10 MED ORDER — SODIUM CHLORIDE 0.9 % IV SOLN
INTRAVENOUS | Status: AC
  Administered 2017-11-10: 11:00:00 via INTRAVENOUS

## 2017-11-10 NOTE — Progress Notes (Signed)
Belva Crome tolerated hydration well without complaints or incident.PICC line flushed per protocol both lumens. VSS upon discharge. Pt discharged via wheelchair in satisfactory condition accompanied by his wife

## 2017-11-10 NOTE — Telephone Encounter (Signed)
Patients wife called wanting to know the results of patients labs from today, especially his potassium level because it is hard for patient to swallow the potassium pill.  Reviewed with Dr. Walden Field before calling patient back. Explained to wife that labs were stable but he still needed to keep taking the potassium. Wife verbalized understanding.

## 2017-11-10 NOTE — Patient Instructions (Signed)
[place] at [facility] Discharge Instructions  Pt received 2 hrs hydration today. Follow up as scheduled. Call clinic for any questions or concerns.   Thank you for choosing [place] at [facility] to provide your oncology and hematology care.  To afford each patient quality time with our provider, please arrive at least 15 minutes before your scheduled appointment time.   If you have a lab appointment with the [place] please come in thru the  Main Entrance and check in at the main information desk  You need to re-schedule your appointment should you arrive 10 or more minutes late.  We strive to give you quality time with our providers, and arriving late affects you and other patients whose appointments are after yours.  Also, if you no show three or more times for appointments you may be dismissed from the clinic at the providers discretion.     Again, thank you for choosing [facility].  Our hope is that these requests will decrease the amount of time that you wait before being seen by our physicians.       _____________________________________________________________  Should you have questions after your visit to [facility], please contact our office at [phone] between the hours of 8:30 a.m. and 4:30 p.m.  Voicemails left after 4:30 p.m. will not be returned until the following business day.  For prescription refill requests, have your pharmacy contact our office.       Resources For Cancer Patients and their Caregivers ? American Cancer Society: Can assist with transportation, wigs, general needs, runs Look Good Feel Better.        224-029-6102 ? Cancer Care: Provides financial assistance, online support groups, medication/co-pay assistance.  1-800-813-HOPE 579-198-3641) ? [place] Assists [place] cancer patients and their families through emotional , educational and  financial support.  732-133-5413 ? [place] DSS Where to apply for food stamps, Medicaid and utility assistance. (319)696-1423 ? RCATS: Transportation to medical appointments. 838-421-6424 ? Social Security Administration: May apply for disability if have a Stage IV cancer. 207 087 6484 (934) 510-8175 ? [employer], Disability and Transit Services: Assists with nutrition, care and transit needs. [place] Support Programs:   > Cancer Support Group  2nd Tuesday of the month 1pm-2pm, Journey Room   > Creative Journey  3rd Tuesday of the month 1130am-1pm, Journey Room

## 2017-11-11 ENCOUNTER — Ambulatory Visit (HOSPITAL_COMMUNITY): Payer: Medicare Other | Admitting: Hematology

## 2017-11-13 ENCOUNTER — Encounter (HOSPITAL_COMMUNITY): Payer: Self-pay

## 2017-11-13 ENCOUNTER — Inpatient Hospital Stay (HOSPITAL_COMMUNITY): Payer: Medicare Other

## 2017-11-13 ENCOUNTER — Other Ambulatory Visit (HOSPITAL_COMMUNITY): Payer: Self-pay

## 2017-11-13 DIAGNOSIS — C3490 Malignant neoplasm of unspecified part of unspecified bronchus or lung: Secondary | ICD-10-CM

## 2017-11-13 DIAGNOSIS — Z5112 Encounter for antineoplastic immunotherapy: Secondary | ICD-10-CM | POA: Diagnosis not present

## 2017-11-13 MED ORDER — SODIUM CHLORIDE 0.9% FLUSH
3.0000 mL | Freq: Once | INTRAVENOUS | Status: AC
  Administered 2017-11-13: 3 mL via INTRAVENOUS

## 2017-11-13 MED ORDER — HEPARIN SOD (PORK) LOCK FLUSH 100 UNIT/ML IV SOLN
250.0000 [IU] | Freq: Once | INTRAVENOUS | Status: AC
  Administered 2017-11-13: 250 [IU] via INTRAVENOUS

## 2017-11-13 MED ORDER — ONDANSETRON HCL 8 MG PO TABS: 8.0000 mg | ORAL_TABLET | Freq: Two times a day (BID) | ORAL | 1 refills | Status: DC | PRN

## 2017-11-13 NOTE — Telephone Encounter (Signed)
Received refill request from patients pharmacy for Zofran. Reviewed with provider, chart checked and refilled.  

## 2017-11-13 NOTE — Progress Notes (Signed)
Belva Crome tolerated PICC line flush with dressing change well without complaints or incident. Both lumens of PICC line flushed with 3 ml NS and 250 units of Heparin easily per protocol. Blood return noted and both caps changed. PICC line dressing changed also with site WNL. VSS Pt discharged via wheelchair in satisfactory condition accompanied by his wife

## 2017-11-13 NOTE — Patient Instructions (Signed)
[place] at [facility] Discharge Instructions  PICC line flushed today with dressing changed per protocol. Follow-up as scheduled. Call clinic for any questions or concerns   Thank you for choosing [place] at [facility] to provide your oncology and hematology care.  To afford each patient quality time with our provider, please arrive at least 15 minutes before your scheduled appointment time.   If you have a lab appointment with the [place] please come in thru the  Main Entrance and check in at the main information desk  You need to re-schedule your appointment should you arrive 10 or more minutes late.  We strive to give you quality time with our providers, and arriving late affects you and other patients whose appointments are after yours.  Also, if you no show three or more times for appointments you may be dismissed from the clinic at the providers discretion.     Again, thank you for choosing [facility].  Our hope is that these requests will decrease the amount of time that you wait before being seen by our physicians.       _____________________________________________________________  Should you have questions after your visit to [facility], please contact our office at [phone] between the hours of 8:30 a.m. and 4:30 p.m.  Voicemails left after 4:30 p.m. will not be returned until the following business day.  For prescription refill requests, have your pharmacy contact our office.       Resources For Cancer Patients and their Caregivers ? American Cancer Society: Can assist with transportation, wigs, general needs, runs Look Good Feel Better.        845-336-8321 ? Cancer Care: Provides financial assistance, online support groups, medication/co-pay assistance.  1-800-813-HOPE 6122295060) ? [place] Assists [place] cancer patients and their families through  emotional , educational and financial support.  (726)634-2093 ? [place] DSS Where to apply for food stamps, Medicaid and utility assistance. 3478265362 ? RCATS: Transportation to medical appointments. (864)544-3216 ? Social Security Administration: May apply for disability if have a Stage IV cancer. 212 801 4861 (803)170-0892 ? [employer], Disability and Transit Services: Assists with nutrition, care and transit needs. [place] Support Programs:   > Cancer Support Group  2nd Tuesday of the month 1pm-2pm, Journey Room   > Creative Journey  3rd Tuesday of the month 1130am-1pm, Journey Room

## 2017-11-18 ENCOUNTER — Inpatient Hospital Stay (HOSPITAL_COMMUNITY): Payer: Medicare Other

## 2017-11-18 ENCOUNTER — Other Ambulatory Visit: Payer: Self-pay

## 2017-11-18 ENCOUNTER — Encounter (HOSPITAL_COMMUNITY): Payer: Self-pay | Admitting: Hematology

## 2017-11-18 ENCOUNTER — Inpatient Hospital Stay (HOSPITAL_COMMUNITY): Payer: Medicare Other | Attending: Hematology | Admitting: Hematology

## 2017-11-18 VITALS — BP 102/59 | HR 112 | Temp 96.7°F | Resp 22 | Wt 134.4 lb

## 2017-11-18 DIAGNOSIS — Z452 Encounter for adjustment and management of vascular access device: Secondary | ICD-10-CM | POA: Diagnosis not present

## 2017-11-18 DIAGNOSIS — B379 Candidiasis, unspecified: Secondary | ICD-10-CM | POA: Diagnosis not present

## 2017-11-18 DIAGNOSIS — K5900 Constipation, unspecified: Secondary | ICD-10-CM | POA: Diagnosis not present

## 2017-11-18 DIAGNOSIS — B370 Candidal stomatitis: Secondary | ICD-10-CM

## 2017-11-18 DIAGNOSIS — C3432 Malignant neoplasm of lower lobe, left bronchus or lung: Secondary | ICD-10-CM | POA: Diagnosis present

## 2017-11-18 DIAGNOSIS — R110 Nausea: Secondary | ICD-10-CM | POA: Diagnosis not present

## 2017-11-18 DIAGNOSIS — C3490 Malignant neoplasm of unspecified part of unspecified bronchus or lung: Secondary | ICD-10-CM

## 2017-11-18 DIAGNOSIS — R5383 Other fatigue: Secondary | ICD-10-CM | POA: Diagnosis not present

## 2017-11-18 DIAGNOSIS — C782 Secondary malignant neoplasm of pleura: Secondary | ICD-10-CM | POA: Diagnosis not present

## 2017-11-18 DIAGNOSIS — Z5111 Encounter for antineoplastic chemotherapy: Secondary | ICD-10-CM

## 2017-11-18 DIAGNOSIS — B3781 Candidal esophagitis: Secondary | ICD-10-CM

## 2017-11-18 LAB — LACTATE DEHYDROGENASE: LDH: 162 U/L (ref 98–192)

## 2017-11-18 LAB — CBC WITH DIFFERENTIAL/PLATELET
BASOS ABS: 0.2 10*3/uL — AB (ref 0.0–0.1)
BASOS PCT: 1 %
EOS ABS: 0.4 10*3/uL (ref 0.0–0.7)
Eosinophils Relative: 2 %
HCT: 30.1 % — ABNORMAL LOW (ref 39.0–52.0)
Hemoglobin: 9.9 g/dL — ABNORMAL LOW (ref 13.0–17.0)
LYMPHS ABS: 2.9 10*3/uL (ref 0.7–4.0)
Lymphocytes Relative: 15 %
MCH: 28.8 pg (ref 26.0–34.0)
MCHC: 32.9 g/dL (ref 30.0–36.0)
MCV: 87.5 fL (ref 78.0–100.0)
Monocytes Absolute: 1.6 10*3/uL — ABNORMAL HIGH (ref 0.1–1.0)
Monocytes Relative: 8 %
NEUTROS ABS: 14.5 10*3/uL — AB (ref 1.7–7.7)
Neutrophils Relative %: 74 %
Platelets: 276 10*3/uL (ref 150–400)
RBC: 3.44 MIL/uL — ABNORMAL LOW (ref 4.22–5.81)
RDW: 15.1 % (ref 11.5–15.5)
WBC: 19.6 10*3/uL — ABNORMAL HIGH (ref 4.0–10.5)

## 2017-11-18 LAB — COMPREHENSIVE METABOLIC PANEL
ALBUMIN: 2.4 g/dL — AB (ref 3.5–5.0)
ALT: 24 U/L (ref 17–63)
ANION GAP: 11 (ref 5–15)
AST: 26 U/L (ref 15–41)
Alkaline Phosphatase: 173 U/L — ABNORMAL HIGH (ref 38–126)
BILIRUBIN TOTAL: 0.5 mg/dL (ref 0.3–1.2)
BUN: 20 mg/dL (ref 6–20)
CALCIUM: 8.8 mg/dL — AB (ref 8.9–10.3)
CO2: 27 mmol/L (ref 22–32)
Chloride: 94 mmol/L — ABNORMAL LOW (ref 101–111)
Creatinine, Ser: 1 mg/dL (ref 0.61–1.24)
GFR calc Af Amer: >60 mL/min (ref >60)
GFR calc non Af Amer: >60 mL/min (ref >60)
GLUCOSE: 115 mg/dL — AB (ref 65–99)
POTASSIUM: 3.7 mmol/L (ref 3.5–5.1)
SODIUM: 132 mmol/L — AB (ref 135–145)
TOTAL PROTEIN: 6.4 g/dL — AB (ref 6.5–8.1)

## 2017-11-18 MED ORDER — HEPARIN SOD (PORK) LOCK FLUSH 100 UNIT/ML IV SOLN
250.0000 [IU] | Freq: Once | INTRAVENOUS | Status: AC
  Administered 2017-11-18: 250 [IU] via INTRAVENOUS

## 2017-11-18 MED ORDER — FLUCONAZOLE 100 MG PO TABS: 100.0000 mg | ORAL_TABLET | Freq: Every day | ORAL | 1 refills | Status: AC

## 2017-11-18 MED ORDER — SODIUM CHLORIDE 0.9% FLUSH
10.0000 mL | INTRAVENOUS | Status: DC | PRN
  Administered 2017-11-18: 10 mL via INTRAVENOUS
  Filled 2017-11-18: qty 10

## 2017-11-18 NOTE — Progress Notes (Signed)
Belva Crome presented for PICC flush.  See IV assessment in docflowsheets for PICC details.   PICC located right arm.  Good blood return present. PICC flushed with 24ml NS and 250U Heparin, see MAR for further details.  Aser Nylund tolerated procedure well and without incident.  Discharged via wheelchair in c/o family.

## 2017-11-18 NOTE — Assessment & Plan Note (Signed)
1.  Metastatic squamous cell carcinoma of the lung:  -metastatic disease limited to the bilateral pleura and widespread mediastinal adenopathy.   -Foundation 1 CDX testing showed MS-Stable, TMB-intermediate,SOX-2 purification. -First cycle of carboplatin and Abraxane on 09/23/2017.  -Cycle 2 on 10/21/2017 with Abraxane dose reduced to 75/m, could not receive day 15 due to elevated creatinine of 2.03.  Danny Booker functional status is declining.  He lost about 6 pounds.  He is not able to taste foods.  We will give Diflucan for 5 days for thrush.  I will hold off on starting Danny Booker cycle 3 of chemotherapy.  We will obtain CT scan of Danny Booker chest with contrast.  If there is partial response and Danny Booker functional status improves, we will consider single agent Abraxane or other taxane.  If there is progression, we will consider palliative care with hospice.  2.  Nausea: He is taking Compazine every 6 hours which is helping slightly.  I have asked him to take Zofran every 8 hours as needed on top of it.

## 2017-11-18 NOTE — Progress Notes (Signed)
Danny Booker, [place] [address]   CLINIC:  Medical Oncology/Hematology  PCP:  Sinda Du, [address] [place] [address] [phone]   REASON FOR VISIT:  Follow-up for metastatic squamous cell lung cancer.  CURRENT THERAPY: Carboplatin and Abraxane, beginning on 09/23/2017  BRIEF ONCOLOGIC HISTORY:    Squamous carcinoma of lung, unspecified laterality ([place])   09/17/2017 Initial Diagnosis    Squamous carcinoma of lung, unspecified laterality ([place])      10/14/2017 Cancer Staging    Staging form: Lung, AJCC 8th Edition - Clinical stage from 10/14/2017: Stage IV (pM1a) - Signed by Derek Jack, MD on 10/14/2017        CANCER STAGING: Cancer Staging Squamous carcinoma of lung, unspecified laterality ([place]) Staging form: Lung, AJCC 8th Edition - Clinical stage from 10/14/2017: Stage IV (pM1a) - Signed by Derek Jack, MD on 10/14/2017    INTERVAL HISTORY:  Mr. Danny Booker 77 y.o. male returns for follow-up and consideration for next cycle of chemo.   Due for cycle #3 Carbo/Abraxane today. Here today with family.   Chart reviewed. He has lost ~6 lbs in the past week. He is weak. He has fallen; he has a knot/bruise on the back of his head from a fall about 1 week ago. Denies headache.  He doesn't eat well; he generally eats Jell-O, applesauce, 1 Boost, Gatorade, water, juices.  He is not really eating much solid food at all.  His tongue "feels rough"; he is losing taste.  He has not used Veterinary surgeon.  Endorses nausea; no vomiting.  Generally takes ~1 nausea pill per day.  He had minor diarrhea this past week.  He had rectal bleeding in the past few weeks, but that has stopped.  Denies peripheral neuropathy to hands and feet.   Cycle #2 was dose-reduced; he still did not tolerate it well.  He does feel like his breathing is a little better ("about 40% better" per patient).    Will not give chemo today. He  is having a hard time tolerating chemotherapy.  Will repeat CT scan of chest to assess response to treatment thus far.    He is still smoking. Spends much of the day sitting in chair and lying in bed. Reports that he does have bed sores to his buttocks; also with pressure sores to ears from chronic O2 tubing.     REVIEW OF SYSTEMS:  Review of Systems  Constitutional: Positive for appetite change, fatigue and unexpected weight change.  Gastrointestinal: Positive for diarrhea and nausea.  Hematological: Bruises/bleeds easily.     PAST MEDICAL/SURGICAL HISTORY:  Past Medical History:  Diagnosis Date  . Anxiety   . Bipolar disorder ([place])   . Cancer ([place])    skin cancer  . COPD (chronic obstructive pulmonary disease) ([place])   . GERD (gastroesophageal reflux disease)   . HOH (hard of hearing)   . Hypertension    Past Surgical History:  Procedure Laterality Date  . APPENDECTOMY    . CATARACT EXTRACTION, BILATERAL Bilateral   . FLEXIBLE BRONCHOSCOPY N/A 09/04/2017   Procedure: FLEXIBLE BRONCHOSCOPY WITH PROPOFOL;  Surgeon: Sinda Du, MD;  Location: [facility];  Service: Cardiopulmonary;  Laterality: N/A;  . SKIN CANCER EXCISION     hand     SOCIAL HISTORY:  Social History   Socioeconomic History  . Marital status: Married    Spouse name: paula  . Number of children: 6  . Years of  education: Not on file  . Highest education level: Some college, no degree  Occupational History  . Occupation: retired  Scientific laboratory technician  . Financial resource strain: Not hard at all  . Food insecurity:    Worry: Never true    Inability: Never true  . Transportation needs:    Medical: No    Non-medical: No  Tobacco Use  . Smoking status: Current Every Day Smoker    Packs/day: 1.00    Years: 60.00    Pack years: 60.00    Types: Cigarettes  . Smokeless tobacco: Never Used  Substance and Sexual Activity  . Alcohol use: Yes    Comment: 46 oz beer q night  . Drug use: No  . Sexual  activity: Yes    Birth control/protection: None  Lifestyle  . Physical activity:    Days per week: 0 days    Minutes per session: 0 min  . Stress: Not at all  Relationships  . Social connections:    Talks on phone: More than three times a week    Gets together: More than three times a week    Attends religious service: 1 to 4 times per year    Active member of club or organization: No    Attends meetings of clubs or organizations: Never    Relationship status: Married  . Intimate partner violence:    Fear of current or ex partner: No    Emotionally abused: No    Physically abused: No    Forced sexual activity: No  Other Topics Concern  . 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    FAMILY HISTORY:  Family History  Problem Relation Age of Onset  . Heart attack Mother   . Stroke Father   . Leukemia Brother   . Other Brother     CURRENT MEDICATIONS:  Outpatient Encounter Medications as of 11/18/2017  Medication Sig  . albuterol (PROVENTIL HFA;VENTOLIN HFA) 108 (90 Base) MCG/ACT inhaler Inhale 1 puff into the lungs every 6 (six) hours as needed for wheezing or shortness of breath.  Marland Kitchen amLODipine (NORVASC) 5 MG tablet Take 5 mg by mouth daily.  Marland Kitchen atorvastatin (LIPITOR) 10 MG tablet Take 10 mg by mouth daily.   . baclofen (LIORESAL) 10 MG tablet Take 10 mg by mouth daily as needed for muscle spasms.  . Capsaicin (CAPZASIN-HP EX) Apply 1 application topically daily as needed (pain).  . CARBOPLATIN IV Inject into the vein.  Marland Kitchen co-enzyme Q-10 30 MG capsule Take 30 mg by mouth 2 (two) times daily.  Marland Kitchen dronabinol (MARINOL) 5 MG capsule Take 1 capsule (5 mg total) by mouth 2 (two) times daily before lunch and supper.  . DULoxetine (CYMBALTA) 60 MG capsule Take 60 mg by mouth daily.  . Fluticasone-Umeclidin-Vilant (TRELEGY ELLIPTA) 100-62.5-25 MCG/INH AEPB Inhale 1 puff into the lungs at  bedtime.  . Guaifenesin 1200 MG TB12 Take 1,200 mg by mouth daily.  Javier Docker Oil 500 MG CAPS Take 500 mg by mouth at bedtime.  Marland Kitchen LORazepam (ATIVAN) 1 MG tablet Take 1 pill by mouth the day of procedure about 30 minutes before you arrive.  . losartan-hydrochlorothiazide (HYZAAR) 100-12.5 MG tablet Take 1 tablet by mouth daily.  . Misc Natural Products (OSTEO BI-FLEX ADV TRIPLE ST PO) Take 1 tablet by mouth 2 (two) times daily.  . Multiple Vitamin (MULTIVITAMIN WITH MINERALS) TABS tablet Take  1 tablet by mouth daily. One-A-Day Men's 21+  . naproxen sodium (ALEVE) 220 MG tablet Take 220 mg by mouth daily as needed (pain).  . ondansetron (ZOFRAN) 8 MG tablet Take 1 tablet (8 mg total) by mouth 2 (two) times daily as needed for refractory nausea / vomiting. Start on day 3 after carboplatin chemo.  Marland Kitchen PACLitaxel Protein-Bound Part (ABRAXANE IV) Inject into the vein.  . Pembrolizumab (KEYTRUDA IV) Inject into the vein.  . potassium chloride SA (K-DUR,KLOR-CON) 20 MEQ tablet Take 1 tablet (20 mEq total) by mouth daily.  . prochlorperazine (COMPAZINE) 10 MG tablet Take 1 tablet (10 mg total) by mouth every 6 (six) hours as needed (Nausea or vomiting).  . ranitidine (ZANTAC) 150 MG tablet Take 150 mg by mouth 2 (two) times daily as needed for heartburn.   . sodium chloride flush 0.9 % SOLN injection Inject 10 mLs into the vein 2 (two) times a week. Flush 10 mLs  into each catheter of PICC line twice weekly.  (double lumen)  . UNABLE TO FIND Take 200 mLs by mouth 4 (four) times daily as needed (Four times a day as needed). Med Name: Magic Mouthwash  Maalox and Lidocaine 2% mix 1:1 92m swish and spit QID PRN 200 ml with 2 refills Dr. KDelton Coombes . fluconazole (DIFLUCAN) 100 MG tablet Take 1 tablet (100 mg total) by mouth daily.   No facility-administered encounter medications on file as of 11/18/2017.     ALLERGIES:  No Known Allergies   PHYSICAL EXAM:  ECOG Performance status: 2  I have reviewed his  vitals. Physical Exam  HENT:  Oropharyngeal exam shows mucositis.  Pulmonary/Chest: Effort normal and breath sounds normal.  Psychiatric: Mood, memory, affect and judgment normal.     LABORATORY DATA:  I have reviewed the labs as listed.  CBC    Component Value Date/Time   WBC 19.6 (H) 11/18/2017 0858   RBC 3.44 (L) 11/18/2017 0858   HGB 9.9 (L) 11/18/2017 0858   HCT 30.1 (L) 11/18/2017 0858   PLT 276 11/18/2017 0858   MCV 87.5 11/18/2017 0858   MCH 28.8 11/18/2017 0858   MCHC 32.9 11/18/2017 0858   RDW 15.1 11/18/2017 0858   LYMPHSABS 2.9 11/18/2017 0858   MONOABS 1.6 (H) 11/18/2017 0858   EOSABS 0.4 11/18/2017 0858   BASOSABS 0.2 (H) 11/18/2017 0858   CMP Latest Ref Rng & Units 11/18/2017 11/10/2017 11/05/2017  Glucose 65 - 99 mg/dL 115(H) 125(H) 133(H)  BUN 6 - 20 mg/dL 20 29(H) 29(H)  Creatinine 0.61 - 1.24 mg/dL 1.00 1.41(H) 2.03(H)  Sodium 135 - 145 mmol/L 132(L) 132(L) 133(L)  Potassium 3.5 - 5.1 mmol/L 3.7 3.1(L) 3.6  Chloride 101 - 111 mmol/L 94(L) 92(L) 93(L)  CO2 22 - 32 mmol/L _0 Calcium 8.9 - 10.3 mg/dL 8.8(L) 8.3(L) 8.7(L)  Total Protein 6.5 - 8.1 g/dL 6.4(L) 6.6 6.6  Total Bilirubin 0.3 - 1.2 mg/dL 0.5 0.7 0.5  Alkaline Phos 38 - 126 U/L 173(H) 219(H) 116  AST 15 - 41 U/L 26 41 20  ALT 17 - 63 U/L _1 DIAGNOSTIC IMAGING:  I have reviewed his PET/CT scan on 09/16/2017 which showed cavitary neoplasm in the left lower lobe.  Widespread bilateral pleural metastasis.  Widespread mediastinal adenopathy.  No evidence of metastatic disease outside the chest.     ASSESSMENT & PLAN:   Squamous carcinoma of lung, unspecified laterality ([place] 1.  Metastatic squamous  cell carcinoma of the lung:  -metastatic disease limited to the bilateral pleura and widespread mediastinal adenopathy.   -Foundation 1 CDX testing showed MS-Stable, TMB-intermediate,SOX-2 purification. -First cycle of carboplatin and Abraxane on 09/23/2017.  -Cycle 2 on 10/21/2017  with Abraxane dose reduced to 75/m, could not receive day 15 due to elevated creatinine of 2.03.  His functional status is declining.  He lost about 6 pounds.  He is not able to taste foods.  We will give Diflucan for 5 days for thrush.  I will hold off on starting his cycle 3 of chemotherapy.  We will obtain CT scan of his chest with contrast.  If there is partial response and his functional status improves, we will consider single agent Abraxane or other taxane.  If there is progression, we will consider palliative care with hospice.  2.  Nausea: He is taking Compazine every 6 hours which is helping slightly.  I have asked him to take Zofran every 8 hours as needed on top of it.        Orders placed this encounter:  Orders Placed This Encounter  Procedures  . CT Chest W Contrast  . DNR (Do Not Resuscitate)    This note includes documentation from Mike Craze, NP, who was present during this patient's office visit and evaluation.  I have reviewed this note for its completeness and accuracy.  I have edited this note accordingly based on my findings and medical opinion.      Derek Jack, MD [place] [phone]

## 2017-11-20 ENCOUNTER — Inpatient Hospital Stay (HOSPITAL_COMMUNITY): Payer: Medicare Other

## 2017-11-20 ENCOUNTER — Encounter (HOSPITAL_COMMUNITY): Payer: Self-pay

## 2017-11-20 VITALS — BP 100/62 | HR 80 | Temp 97.0°F | Resp 20

## 2017-11-20 DIAGNOSIS — R5383 Other fatigue: Secondary | ICD-10-CM

## 2017-11-20 DIAGNOSIS — C3490 Malignant neoplasm of unspecified part of unspecified bronchus or lung: Secondary | ICD-10-CM

## 2017-11-20 DIAGNOSIS — Z4800 Encounter for change or removal of nonsurgical wound dressing: Secondary | ICD-10-CM

## 2017-11-20 DIAGNOSIS — E860 Dehydration: Secondary | ICD-10-CM

## 2017-11-20 DIAGNOSIS — C3432 Malignant neoplasm of lower lobe, left bronchus or lung: Secondary | ICD-10-CM | POA: Diagnosis not present

## 2017-11-20 MED ORDER — SODIUM CHLORIDE 0.9 % IV SOLN
Freq: Once | INTRAVENOUS | Status: AC
  Administered 2017-11-20: 1000 mL via INTRAVENOUS

## 2017-11-20 MED ORDER — HEPARIN SOD (PORK) LOCK FLUSH 100 UNIT/ML IV SOLN
500.0000 [IU] | Freq: Once | INTRAVENOUS | Status: AC
  Administered 2017-11-20: 500 [IU] via INTRAVENOUS
  Filled 2017-11-20: qty 5

## 2017-11-20 MED ORDER — SODIUM CHLORIDE 0.9% FLUSH
10.0000 mL | Freq: Once | INTRAVENOUS | Status: AC
  Administered 2017-11-20: 10 mL via INTRAVENOUS

## 2017-11-20 NOTE — Patient Instructions (Signed)
[place] at [facility]  Discharge Instructions:  You received hydration today with PICC line flush and dressing change.  _______________________________________________________________  Thank you for choosing [place] at [facility] to provide your oncology and hematology care.  To afford each patient quality time with our providers, please arrive at least 15 minutes before your scheduled appointment.  You need to re-schedule your appointment if you arrive 10 or more minutes late.  We strive to give you quality time with our providers, and arriving late affects you and other patients whose appointments are after yours.  Also, if you no show three or more times for appointments you may be dismissed from the clinic.  Again, thank you for choosing [place] at [place] hope is that these requests will allow you access to exceptional care and in a timely manner. _______________________________________________________________  If you have questions after your visit, please contact our office at [phone] between the hours of 8:30 a.m. and 5:00 p.m. Voicemails left after 4:30 p.m. will not be returned until the following business day. _______________________________________________________________  For prescription refill requests, have your pharmacy contact our office. _______________________________________________________________  Recommendations made by the consultant and any test results will be sent to your referring physician. _______________________________________________________________

## 2017-11-20 NOTE — Progress Notes (Signed)
Patient in for PICC line dressing change and flush.  Site changed and flushed per protocol.  No redness or drainage noted at site.  Flushed with good blood return.  No complaints of pain with flush.   Patients vital signs reviewed with Dr. Delton Coombes with verbal orders for 1 liter normal saline over 2 hours.  Patient agreeable.  Patients wife stated he does not drink well at home, mostly pepsi and coffee.  Patient is also not moving around a lot at home per wife.  Reviewed the importance of good hydration and trying to be more active if able with understanding verbalized.    Patient tolerated hydration with no complaints voiced.  VSs with discharge and voided before leaving.  Left via wheelchair with wife with no s/s of distress noted.

## 2017-11-24 ENCOUNTER — Ambulatory Visit (HOSPITAL_COMMUNITY)
Admission: RE | Admit: 2017-11-24 | Discharge: 2017-11-24 | Disposition: A | Discharge status: Home / Self Care | Payer: Medicare Other | Source: Ambulatory Visit | Attending: Hematology | Admitting: Hematology

## 2017-11-24 DIAGNOSIS — R918 Other nonspecific abnormal finding of lung field: Secondary | ICD-10-CM | POA: Diagnosis not present

## 2017-11-24 DIAGNOSIS — J439 Emphysema, unspecified: Secondary | ICD-10-CM | POA: Diagnosis not present

## 2017-11-24 DIAGNOSIS — R590 Localized enlarged lymph nodes: Secondary | ICD-10-CM | POA: Insufficient documentation

## 2017-11-24 DIAGNOSIS — R911 Solitary pulmonary nodule: Secondary | ICD-10-CM | POA: Diagnosis not present

## 2017-11-24 DIAGNOSIS — I700 Atherosclerosis of aorta: Secondary | ICD-10-CM | POA: Diagnosis not present

## 2017-11-24 DIAGNOSIS — C3490 Malignant neoplasm of unspecified part of unspecified bronchus or lung: Secondary | ICD-10-CM | POA: Diagnosis not present

## 2017-11-24 MED ORDER — IOPAMIDOL (ISOVUE-300) INJECTION 61%
100.0000 mL | Freq: Once | INTRAVENOUS | Status: AC | PRN
  Administered 2017-11-24: 75 mL via INTRAVENOUS

## 2017-11-27 ENCOUNTER — Inpatient Hospital Stay (HOSPITAL_BASED_OUTPATIENT_CLINIC_OR_DEPARTMENT_OTHER): Payer: Medicare Other | Admitting: Hematology

## 2017-11-27 ENCOUNTER — Encounter (HOSPITAL_COMMUNITY): Payer: Self-pay

## 2017-11-27 ENCOUNTER — Encounter (HOSPITAL_COMMUNITY): Payer: Self-pay | Admitting: Hematology

## 2017-11-27 ENCOUNTER — Inpatient Hospital Stay (HOSPITAL_COMMUNITY): Payer: Medicare Other

## 2017-11-27 DIAGNOSIS — K5900 Constipation, unspecified: Secondary | ICD-10-CM

## 2017-11-27 DIAGNOSIS — C782 Secondary malignant neoplasm of pleura: Secondary | ICD-10-CM

## 2017-11-27 DIAGNOSIS — C3432 Malignant neoplasm of lower lobe, left bronchus or lung: Secondary | ICD-10-CM

## 2017-11-27 DIAGNOSIS — C3490 Malignant neoplasm of unspecified part of unspecified bronchus or lung: Secondary | ICD-10-CM

## 2017-11-27 DIAGNOSIS — R5383 Other fatigue: Secondary | ICD-10-CM

## 2017-11-27 DIAGNOSIS — R110 Nausea: Secondary | ICD-10-CM

## 2017-11-27 LAB — CBC WITH DIFFERENTIAL/PLATELET
BASOS PCT: 0 %
Basophils Absolute: 0 10*3/uL (ref 0.0–0.1)
EOS ABS: 0.6 10*3/uL (ref 0.0–0.7)
EOS PCT: 3 %
HCT: 28.8 % — ABNORMAL LOW (ref 39.0–52.0)
Hemoglobin: 9.5 g/dL — ABNORMAL LOW (ref 13.0–17.0)
Lymphocytes Relative: 19 %
Lymphs Abs: 3.3 10*3/uL (ref 0.7–4.0)
MCH: 29.5 pg (ref 26.0–34.0)
MCHC: 33 g/dL (ref 30.0–36.0)
MCV: 89.4 fL (ref 78.0–100.0)
MONO ABS: 1.2 10*3/uL — AB (ref 0.1–1.0)
Monocytes Relative: 7 %
Neutro Abs: 12.1 10*3/uL — ABNORMAL HIGH (ref 1.7–7.7)
Neutrophils Relative %: 71 %
Platelets: 393 10*3/uL (ref 150–400)
RBC: 3.22 MIL/uL — ABNORMAL LOW (ref 4.22–5.81)
RDW: 16.2 % — AB (ref 11.5–15.5)
WBC: 17.2 10*3/uL — ABNORMAL HIGH (ref 4.0–10.5)

## 2017-11-27 LAB — COMPREHENSIVE METABOLIC PANEL
ALBUMIN: 2.7 g/dL — AB (ref 3.5–5.0)
ALT: 28 U/L (ref 17–63)
AST: 28 U/L (ref 15–41)
Alkaline Phosphatase: 124 U/L (ref 38–126)
Anion gap: 9 (ref 5–15)
BILIRUBIN TOTAL: 0.7 mg/dL (ref 0.3–1.2)
BUN: 22 mg/dL — AB (ref 6–20)
CALCIUM: 8.9 mg/dL (ref 8.9–10.3)
CO2: 26 mmol/L (ref 22–32)
CREATININE: 1 mg/dL (ref 0.61–1.24)
Chloride: 94 mmol/L — ABNORMAL LOW (ref 101–111)
GFR calc non Af Amer: >60 mL/min (ref >60)
GLUCOSE: 104 mg/dL — AB (ref 65–99)
Potassium: 4.5 mmol/L (ref 3.5–5.1)
Sodium: 129 mmol/L — ABNORMAL LOW (ref 135–145)
Total Protein: 6.8 g/dL (ref 6.5–8.1)

## 2017-11-27 MED ORDER — HEPARIN SOD (PORK) LOCK FLUSH 100 UNIT/ML IV SOLN
INTRAVENOUS | Status: AC
  Filled 2017-11-27: qty 5

## 2017-11-27 MED ORDER — HEPARIN SOD (PORK) LOCK FLUSH 100 UNIT/ML IV SOLN
500.0000 [IU] | Freq: Once | INTRAVENOUS | Status: AC
  Administered 2017-11-27: 500 [IU] via INTRAVENOUS

## 2017-11-27 MED ORDER — SODIUM CHLORIDE 0.9% FLUSH
10.0000 mL | Freq: Once | INTRAVENOUS | Status: AC
  Administered 2017-11-27: 10 mL via INTRAVENOUS

## 2017-11-27 NOTE — Progress Notes (Signed)
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  CURRENT THERAPY: Carboplatin and Abraxane, beginning on 09/23/2017  BRIEF ONCOLOGIC HISTORY:    Squamous carcinoma of lung, unspecified laterality ([place])   09/17/2017 Initial Diagnosis    Squamous carcinoma of lung, unspecified laterality ([place])      10/14/2017 Cancer Staging    Staging form: Lung, AJCC 8th Edition - Clinical stage from 10/14/2017: Stage IV (pM1a) - Signed by Derek Jack, MD on 10/14/2017        CANCER STAGING: Cancer Staging Squamous carcinoma of lung, unspecified laterality ([place]) Staging form: Lung, AJCC 8th Edition - Clinical stage from 10/14/2017: Stage IV (pM1a) - Signed by Derek Jack, MD on 10/14/2017    INTERVAL HISTORY:  Danny Booker 77 y.o. male returns for follow-up and consideration for additional treatment for metastatic lung cancer.   He feels like he is "a little bit stronger" than last week.  Appetite and energy levels both 25%.  Chemo was held last week d/t weakness, decreased tolerance, and lab abnormalities.  He has had 2 partial cycles of chemotherapy.   Recent CT chest results reviewed with patient/family.  Here today with his family.    Patient does not hear well.  His family helps explain things to him as well.  Recommendations include additional treatment with only 1 medication (vs 3 medications).  The other option would be stopping treatment altogether and enrolling in hospice.  The possible side effects, risks, and prognosis with these options were discussed in detail today.  The benefits of hospice discussed as well.      REVIEW OF SYSTEMS:  Review of Systems  Constitutional: Positive for appetite change and fatigue.  Gastrointestinal: Positive  for constipation and nausea.  Hematological: Bruises/bleeds easily.  All other systems reviewed and are negative.    PAST MEDICAL/SURGICAL HISTORY:  Past Medical History:  Diagnosis Date  . Anxiety   . Bipolar disorder ([place])   . Cancer ([place])    skin cancer  . COPD (chronic obstructive pulmonary disease) ([facility] [place])   . GERD (gastroesophageal reflux disease)   . HOH (hard of hearing)   . Hypertension    Past Surgical History:  Procedure Laterality Date  . APPENDECTOMY    . CATARACT EXTRACTION, BILATERAL Bilateral   . FLEXIBLE BRONCHOSCOPY N/A 09/04/2017   Procedure: FLEXIBLE BRONCHOSCOPY WITH PROPOFOL;  Surgeon: Sinda Du, MD;  Location: [facility];  Service: Cardiopulmonary;  Laterality: N/A;  . SKIN CANCER EXCISION     hand     SOCIAL HISTORY:  Social History   Socioeconomic History  . Marital status: Married    Spouse name: Danny Booker  . Number of children: 6  . Years of education: Not on file  . Highest education level: Some college, no degree  Occupational History  . Occupation: retired  Scientific laboratory technician  . Financial resource strain: Not hard at all  . Food insecurity:    Worry: Never true    Inability: Never true  . Transportation needs:    Medical: No    Non-medical: No  Tobacco Use  . Smoking status: Current Every Day Smoker    Packs/day: 1.00    Years: 60.00    Pack years: 60.00    Types: Cigarettes  . Smokeless tobacco: Never Used  Substance and Sexual Activity  . Alcohol use: Yes    Comment: 46 oz beer q night  . Drug use: No  . Sexual activity: Yes    Birth control/protection: None  Lifestyle  . Physical activity:    Days per week: 0 days    Minutes per session: 0 min  . Stress: Not at all  Relationships  . Social connections:    Talks on phone: More than three times a week    Gets together: More than three times a week    Attends religious service: 1 to 4 times per year    Active member of club or organization: No    Attends meetings of  clubs or organizations: Never    Relationship status: Married  . Intimate partner violence:    Fear of current or ex partner: No    Emotionally abused: No    Physically abused: No    Forced sexual activity: No  Other Topics Concern  . 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    FAMILY HISTORY:  Family History  Problem Relation Age of Onset  . Heart attack Mother   . Stroke Father   . Leukemia Brother   . Other Brother     CURRENT MEDICATIONS:  Outpatient Encounter Medications as of 11/27/2017  Medication Sig  . albuterol (PROVENTIL HFA;VENTOLIN HFA) 108 (90 Base) MCG/ACT inhaler Inhale 1 puff into the lungs every 6 (six) hours as needed for wheezing or shortness of breath.  Marland Kitchen amLODipine (NORVASC) 5 MG tablet Take 5 mg by mouth daily.  Marland Kitchen atorvastatin (LIPITOR) 10 MG tablet Take 10 mg by mouth daily.   . baclofen (LIORESAL) 10 MG tablet Take 10 mg by mouth daily as needed for muscle spasms.  . Capsaicin (CAPZASIN-HP EX) Apply 1 application topically daily as needed (pain).  . CARBOPLATIN IV Inject into the vein.  Marland Kitchen co-enzyme Q-10 30 MG capsule Take 30 mg by mouth 2 (two) times daily.  Marland Kitchen dronabinol (MARINOL) 5 MG capsule Take 1 capsule (5 mg total) by mouth 2 (two) times daily before lunch and supper.  . DULoxetine (CYMBALTA) 60 MG capsule Take 60 mg by mouth daily.  . fluconazole (DIFLUCAN) 100 MG tablet Take 1 tablet (100 mg total) by mouth daily.  . Fluticasone-Umeclidin-Vilant (TRELEGY ELLIPTA) 100-62.5-25 MCG/INH AEPB Inhale 1 puff into the lungs at bedtime.  . Guaifenesin 1200 MG TB12 Take 1,200 mg by mouth daily.  Danny Booker Oil 500 MG CAPS Take 500 mg by mouth at bedtime.  Marland Kitchen LORazepam (ATIVAN) 1 MG tablet Take 1 pill by mouth the day of procedure about 30 minutes before you arrive.  . losartan-hydrochlorothiazide (HYZAAR) 100-12.5 MG tablet Take 1 tablet by mouth daily.  . Misc  Natural Products (OSTEO BI-FLEX ADV TRIPLE ST PO) Take 1 tablet by mouth 2 (two) times daily.  . Multiple Vitamin (MULTIVITAMIN WITH MINERALS) TABS tablet Take 1 tablet by mouth daily. One-A-Day Men's 23+  . naproxen sodium (ALEVE) 220 MG tablet Take 220 mg by mouth daily as needed (pain).  . ondansetron (ZOFRAN) 8 MG tablet Take 1 tablet (8 mg total) by mouth 2 (two) times daily as needed for refractory nausea / vomiting. Start on day 3 after carboplatin chemo.  Marland Kitchen PACLitaxel Protein-Bound Part (ABRAXANE IV) Inject into the vein.  . Pembrolizumab (KEYTRUDA IV) Inject into the vein.  . potassium chloride SA (K-DUR,KLOR-CON) 20  MEQ tablet Take 1 tablet (20 mEq total) by mouth daily.  . prochlorperazine (COMPAZINE) 10 MG tablet Take 1 tablet (10 mg total) by mouth every 6 (six) hours as needed (Nausea or vomiting).  . ranitidine (ZANTAC) 150 MG tablet Take 150 mg by mouth 2 (two) times daily as needed for heartburn.   . sodium chloride flush 0.9 % SOLN injection Inject 10 mLs into the vein 2 (two) times a week. Flush 10 mLs  into each catheter of PICC line twice weekly.  (double lumen)  . UNABLE TO FIND Take 200 mLs by mouth 4 (four) times daily as needed (Four times a day as needed). Med Name: Magic Mouthwash  Maalox and Lidocaine 2% mix 1:1 27m swish and spit QID PRN 200 ml with 2 refills Dr. KDelton Coombes  No facility-administered encounter medications on file as of 11/27/2017.     ALLERGIES:  No Known Allergies   PHYSICAL EXAM:  ECOG Performance status: 2  I have reviewed his vitals. Physical Exam Deferred.  LABORATORY DATA:  I have reviewed the labs as listed.  CBC    Component Value Date/Time   WBC 17.2 (H) 11/27/2017 1031   RBC 3.22 (L) 11/27/2017 1031   HGB 9.5 (L) 11/27/2017 1031   HCT 28.8 (L) 11/27/2017 1031   PLT 393 11/27/2017 1031   MCV 89.4 11/27/2017 1031   MCH 29.5 11/27/2017 1031   MCHC 33.0 11/27/2017 1031   RDW 16.2 (H) 11/27/2017 1031   LYMPHSABS 3.3  11/27/2017 1031   MONOABS 1.2 (H) 11/27/2017 1031   EOSABS 0.6 11/27/2017 1031   BASOSABS 0.0 11/27/2017 1031   CMP Latest Ref Rng & Units 11/27/2017 11/18/2017 11/10/2017  Glucose 65 - 99 mg/dL 104(H) 115(H) 125(H)  BUN 6 - 20 mg/dL 22(H) 20 29(H)  Creatinine 0.61 - 1.24 mg/dL 1.00 1.00 1.41(H)  Sodium 135 - 145 mmol/L 129(L) 132(L) 132(L)  Potassium 3.5 - 5.1 mmol/L 4.5 3.7 3.1(L)  Chloride 101 - 111 mmol/L 94(L) 94(L) 92(L)  CO2 22 - 32 mmol/L _0 Calcium 8.9 - 10.3 mg/dL 8.9 8.8(L) 8.3(L)  Total Protein 6.5 - 8.1 g/dL 6.8 6.4(L) 6.6  Total Bilirubin 0.3 - 1.2 mg/dL 0.7 0.5 0.7  Alkaline Phos 38 - 126 U/L 124 173(H) 219(H)  AST 15 - 41 U/L 28 26 41  ALT 17 - 63 U/L _1 DIAGNOSTIC IMAGING:  I have reviewed his PET/CT scan on 09/16/2017 which showed cavitary neoplasm in the left lower lobe.  Widespread bilateral pleural metastasis.  Widespread mediastinal adenopathy.  No evidence of metastatic disease outside the chest.  I have also reviewed the images of his most recent CT scan of the chest and compared it with the prior CT scan and PET scan.   ASSESSMENT & PLAN:   Squamous carcinoma of lung, unspecified laterality ([place] 1.  Metastatic squamous cell carcinoma of the lung:  -metastatic disease limited to the bilateral pleura and widespread mediastinal adenopathy.   -Foundation 1 CDX testing showed MS-Stable, TMB-intermediate,SOX-2 purification. -First cycle of carboplatin and Abraxane on 09/23/2017.  -Cycle 2 on 10/21/2017 with Abraxane dose reduced to 75/m, could not receive day 15 due to elevated creatinine of 2.03.  KBeryle Flockwas added. - I have discussed the results of the CT scan of the chest which showed decrease in size of the dominant lung mass.  Other small lung nodules have been stable.  No new areas was reported.  He has  gotten partial response with 2 cycles of chemotherapy.  However he had declining functional status.  We had a prolonged discussion with the  patient, his wife, son and grandson about options of continuing chemotherapy with single agent Abraxane, 3 weeks on 1 week off at a reduced dose due to intolerance versus best supportive care in the form of hospice.  He cannot tolerate triple drug chemotherapy regimen.  Hence I have recommended discontinuing carboplatin and Keytruda.  We have also talked at length about the services offered by hospice.  Patient would like to think about it and let us know next week.  2.  Nausea: He is taking Compazine every 6 hours which is helping slightly.  He will take Zofran every 8 hours as needed.    Total time spent is 45 minutes with more than 50% of the time spent face-to-face discussing scan results, further treatment options including hospice and coordination of care. This note includes documentation from Mike Craze, NP, who was present during this patient's office visit and evaluation.  I have reviewed this note for its completeness and accuracy.  I have edited this note accordingly based on my findings and medical opinion.      Derek Jack, MD [place] [phone]

## 2017-11-27 NOTE — Progress Notes (Signed)
Patient to treatment area for PICC line flush and dressing change.  Both lines with good blood return and flushed easily with no complaints of pain.  Insertion site clean and dry with no redness or drainage.  New dressing applied.  VSS with discharge and left via wheelchair with family.  No s/s of distress noted.

## 2017-11-27 NOTE — Assessment & Plan Note (Signed)
1.  Metastatic squamous cell carcinoma of the lung:  -metastatic disease limited to the bilateral pleura and widespread mediastinal adenopathy.   -Foundation 1 CDX testing showed MS-Stable, TMB-intermediate,SOX-2 purification. -First cycle of carboplatin and Abraxane on 09/23/2017.  -Cycle 2 on 10/21/2017 with Abraxane dose reduced to 75/m, could not receive day 15 due to elevated creatinine of 2.03.  Beryle Flock was added. - I have discussed the results of the CT scan of the chest which showed decrease in size of the dominant lung mass.  Other small lung nodules have been stable.  No new areas was reported.  He has gotten partial response with 2 cycles of chemotherapy.  However he had declining functional status.  We had a prolonged discussion with the patient, his wife, son and grandson about options of continuing chemotherapy with single agent Abraxane, 3 weeks on 1 week off at a reduced dose due to intolerance versus best supportive care in the form of hospice.  He cannot tolerate triple drug chemotherapy regimen.  Hence I have recommended discontinuing carboplatin and Keytruda.  We have also talked at length about the services offered by hospice.  Patient would like to think about it and let us know next week.  2.  Nausea: He is taking Compazine every 6 hours which is helping slightly.  He will take Zofran every 8 hours as needed.

## 2017-11-27 NOTE — Patient Instructions (Signed)
[facility] at [facility]  Discharge Instructions:  Your PICC line was flushed today with dressin gchange.  _______________________________________________________________  Thank you for choosing [place] at [facility] to provide your oncology and hematology care.  To afford each patient quality time with our providers, please arrive at least 15 minutes before your scheduled appointment.  You need to re-schedule your appointment if you arrive 10 or more minutes late.  We strive to give you quality time with our providers, and arriving late affects you and other patients whose appointments are after yours.  Also, if you no show three or more times for appointments you may be dismissed from the clinic.  Again, thank you for choosing [facility] at [place] hope is that these requests will allow you access to exceptional care and in a timely manner. _______________________________________________________________  If you have questions after your visit, please contact our office at [phone] between the hours of 8:30 a.m. and 5:00 p.m. Voicemails left after 4:30 p.m. will not be returned until the following business day. _______________________________________________________________  For prescription refill requests, have your pharmacy contact our office. _______________________________________________________________  Recommendations made by the consultant and any test results will be sent to your referring physician. _______________________________________________________________

## 2017-11-30 ENCOUNTER — Telehealth (HOSPITAL_COMMUNITY): Payer: Self-pay

## 2017-11-30 NOTE — Telephone Encounter (Signed)
Patient's wife called and stated patient had decided to go with Hospice instead of getting more treatments. Explained to wife that I would let Dr. Raliegh Ip know and a referral will be made to Hospice. Wife verbalized understanding.

## 2017-12-01 ENCOUNTER — Encounter (HOSPITAL_COMMUNITY): Payer: Self-pay | Admitting: Adult Health

## 2018-02-18 DEATH — deceased

## 2018-11-06 ENCOUNTER — Other Ambulatory Visit: Payer: Self-pay | Admitting: Nurse Practitioner

## 2019-02-20 IMAGING — CT CT CHEST W/ CM
2 of 3 series · 14 of 36 positions shown, 17 images · IV contrast (iopamidol)
Comparison: 09/16/2017 PET-CT.  08/26/2017 chest CT.

CLINICAL DATA: Stage IV left lower lobe lung cancer. Interval
chemotherapy. Restaging.

EXAM:
CT CHEST WITH CONTRAST
TECHNIQUE: Multidetector CT imaging of the chest was performed during
intravenous contrast administration.
CONTRAST:  75mL P2BW12-MNN IOPAMIDOL (P2BW12-MNN) INJECTION 61%

[Series 2: axial st · axial · 0.67mm/px · z∈[-560,-274]mm · 11 of 169 slices shown, 14 images]
[im 13/169  mediastinal]
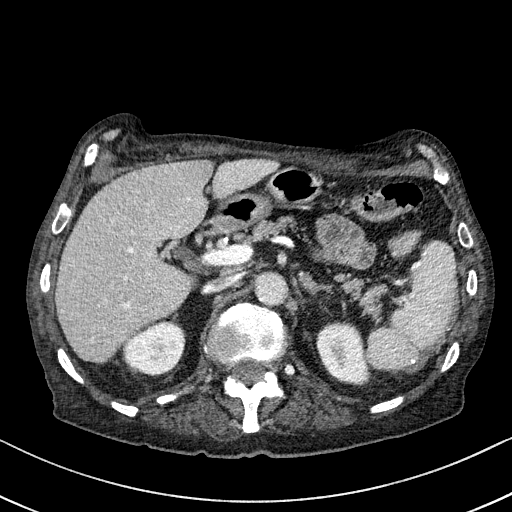
[im 13/169  lung]
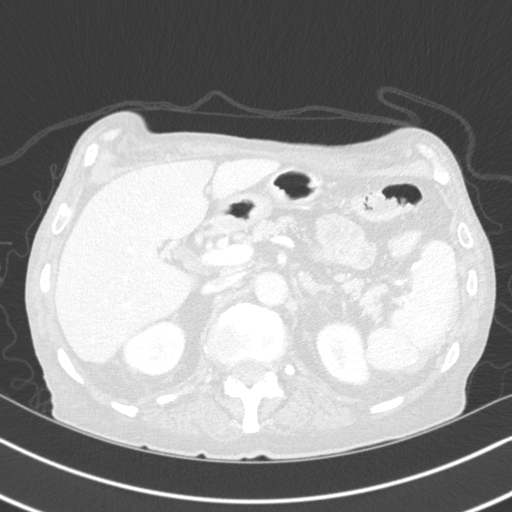
[im 25/169  lung]
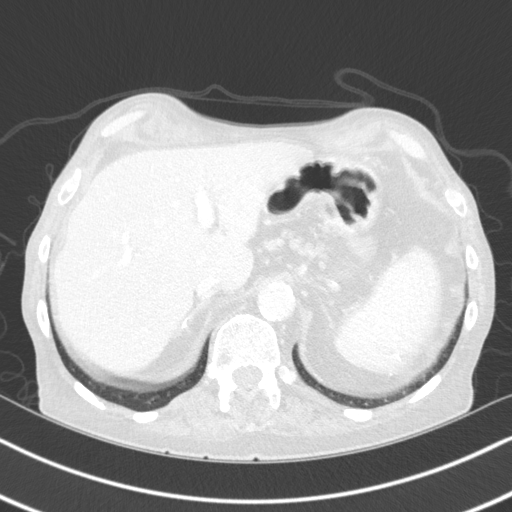
[im 38/169  lung]
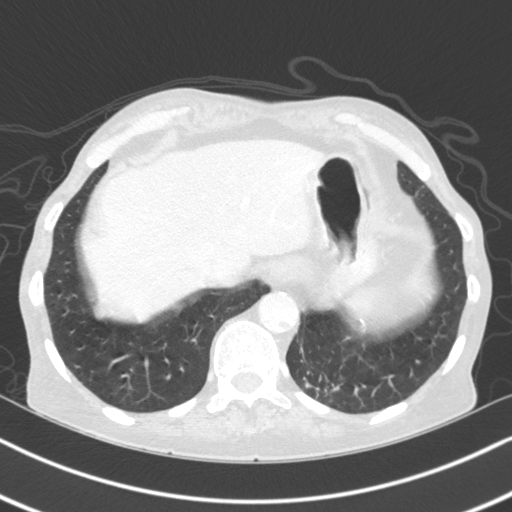
[im 57/169  lung]
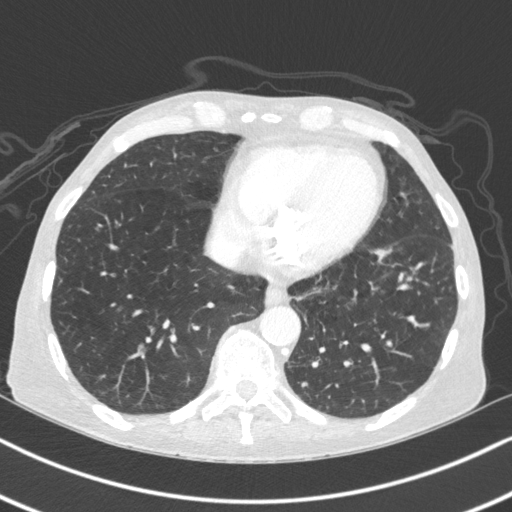
[im 69/169  mediastinal]
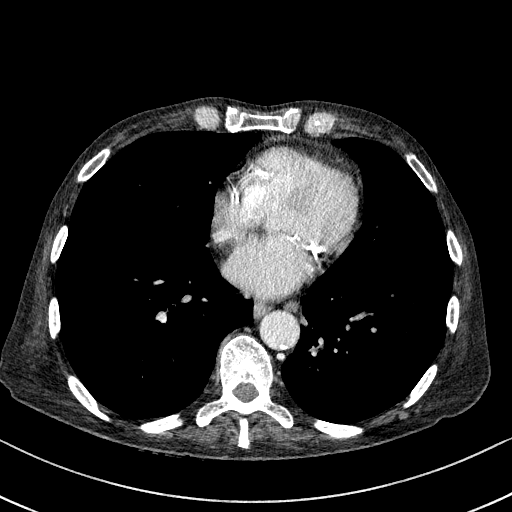
[im 69/169  lung]
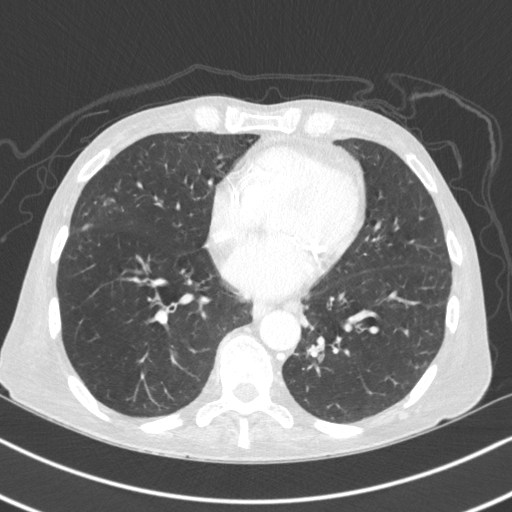
[im 88/169  lung]
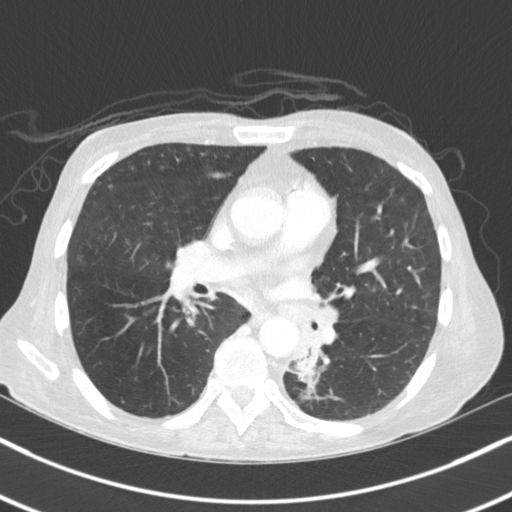
[im 100/169  lung]
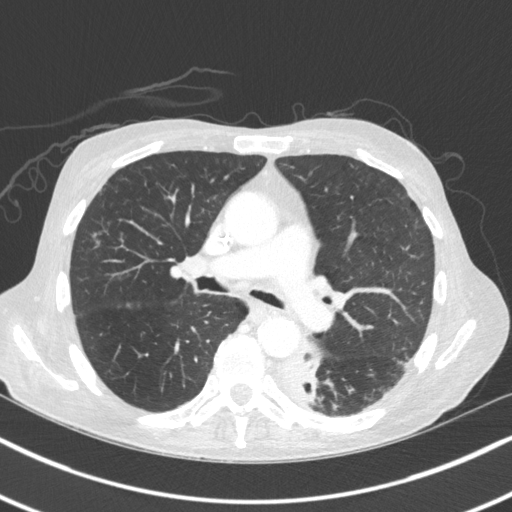
[im 113/169  lung]
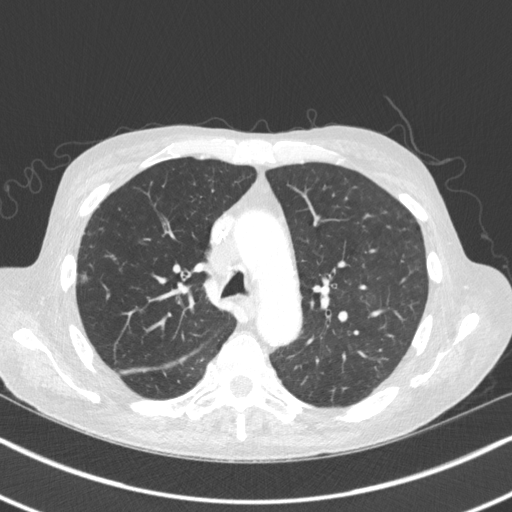
[im 131/169  mediastinal]
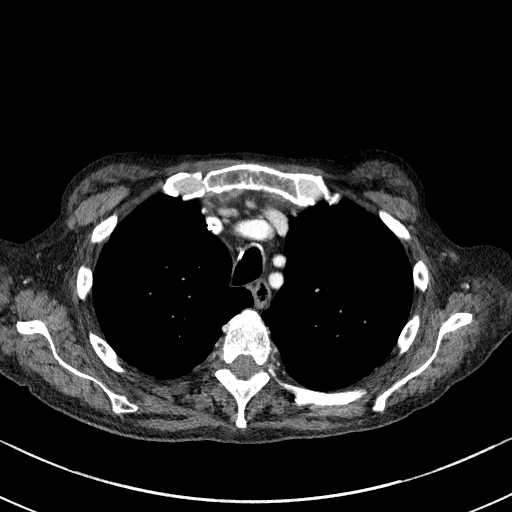
[im 131/169  lung]
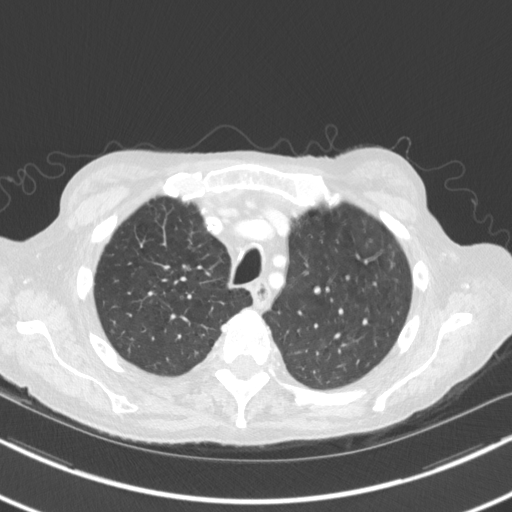
[im 144/169  lung]
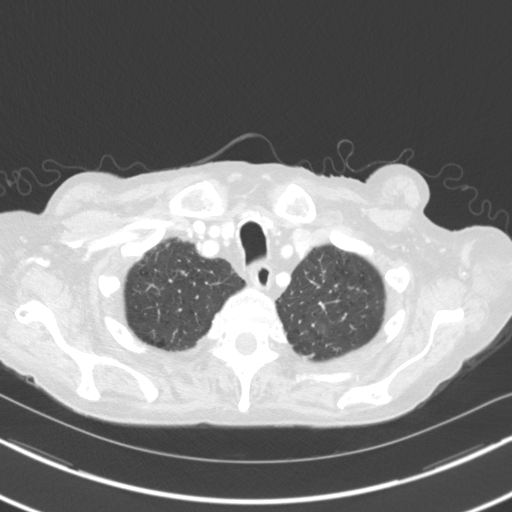
[im 156/169  lung]
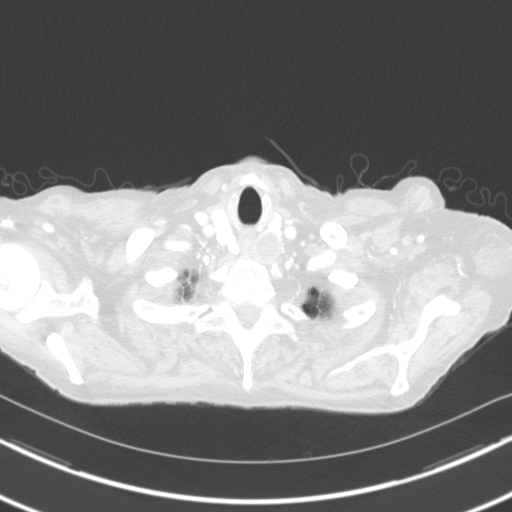

[Series 5: coronal · coronal · 0.63mm/px · 3 of 129 slices shown]
[im 26/129  lung]
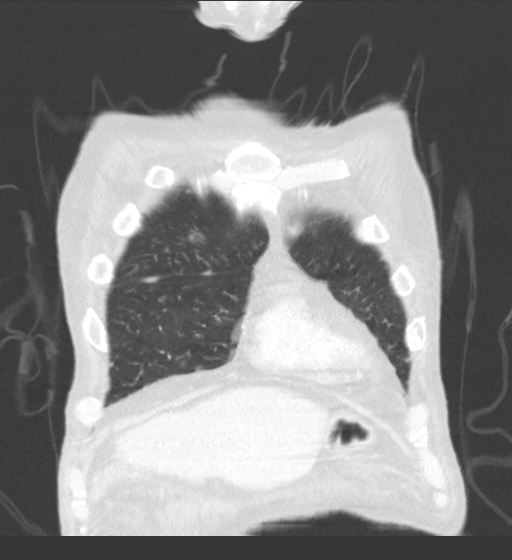
[im 52/129  lung]
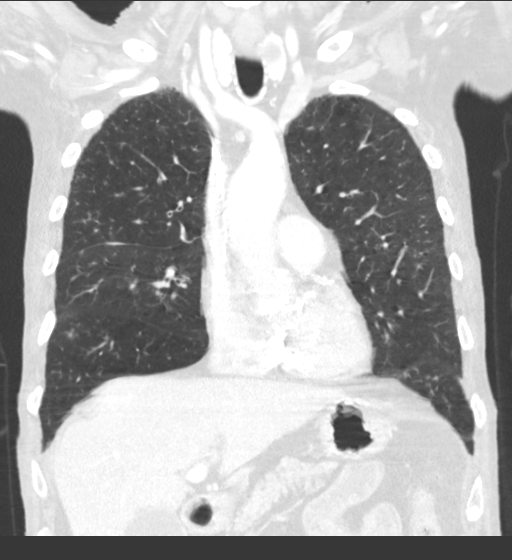
[im 77/129  lung]
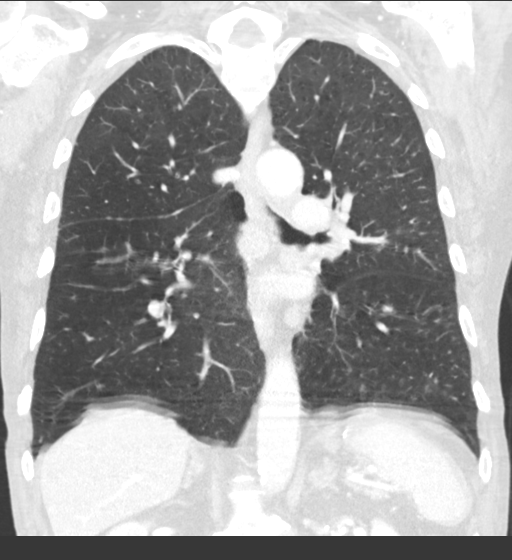

[14 of 36 positions shown; findings below may reference images not displayed]

FINDINGS: Cardiovascular: Normal heart size. No significant pericardial
fluid/thickening. Three-vessel coronary atherosclerosis. Coarse
mitral annular calcification. Right PICC terminates at the
cavoatrial junction. Atherosclerotic nonaneurysmal thoracic aorta.
Normal caliber pulmonary arteries. No central pulmonary emboli.

Mediastinum/Nodes: Dominant mixed density 2.9 cm posterior left
thyroid lobe nodule, unchanged. Unremarkable esophagus. No axillary
adenopathy. Decreased right paratracheal adenopathy up to 1.8 cm
(series 2/image 59), decreased from 2.6 cm. Stable enlarged 1.3 cm
subcarinal node (series 2/image 78). Enlarged 1.2 cm AP window node
(series 2/image 62), decreased from 1.9 cm. Stable mildly enlarged
1.2 cm right hilar node (series 2/image 70). Decreased left hilar
adenopathy up to 1.3 cm (series 2/image 80), previously 1.6 cm.

Lungs/Pleura: No pneumothorax. No pleural effusion. Moderate
centrilobular and mild paraseptal emphysema with diffuse bronchial
wall thickening. Cavitary central left lower lobe 4.6 x 3.0 cm lung
mass (series 4/image 74), decreased from 5.4 x 4.0 cm on 08/26/2017.
Irregular 1.7 x 1.4 cm subpleural nodule in the posterior left upper
lobe (series 4/image 63), previously 1.8 x 1.5 cm using similar
measurement technique, not definitely changed. There is new patchy
tree-in-bud type opacity and ill-defined centrilobular nodularity
throughout both lungs involving all lung lobes, for example a new 6
mm subsolid peripheral right upper lobe centrilobular nodule (series
4/image 59) and a new 7 mm left upper lobe sub solid nodule (series
4/image 74). Irregular nodular thickening along the right fissures
is not appreciably changed.

Upper abdomen: No acute abnormality. Stable scattered peripheral
calcification in the spleen from remote insult. Stable appearance of
the adrenal glands with irregular thickening of the left greater
than right adrenal glands and stable coarse calcifications in the
right adrenal gland compatible with remote insult.

Musculoskeletal: No aggressive appearing focal osseous lesions. Mild
thoracic spondylosis.
IMPRESSION: 1. Cavitary central left lower lobe lung mass is mildly decreased in
size. Irregular subpleural nodule along the superior segment left
major fissure is stable. Stable irregular nodular thickening along
the right fissures.
2. Mediastinal and bilateral hilar lymphadenopathy is stable to
decreased in size.
3. New patchy ill-defined centrilobular nodularity and tree-in-bud
opacity throughout both lungs. These findings are more likely due to
an infectious or inflammatory bronchiolitis, although lymphangitic
tumor spread is not entirely excluded. Attention on short-term
follow-up chest CT advised in 3 months.

Aortic Atherosclerosis (RUQ7M-F9W.W) and Emphysema ([id number].H).
